# Patient Record
Sex: Female | Born: 1975 | Race: Black or African American | Hispanic: No | Smoking: Never smoker
Health system: Southern US, Community
[De-identification: ages and names within clinical notes are randomized; demographics above are authoritative.]

## PROBLEM LIST (undated history)

## (undated) DIAGNOSIS — D219 Benign neoplasm of connective and other soft tissue, unspecified: Secondary | ICD-10-CM

## (undated) DIAGNOSIS — R7303 Prediabetes: Secondary | ICD-10-CM

## (undated) DIAGNOSIS — F32A Depression, unspecified: Secondary | ICD-10-CM

## (undated) DIAGNOSIS — G43909 Migraine, unspecified, not intractable, without status migrainosus: Secondary | ICD-10-CM

## (undated) HISTORY — DX: Depression, unspecified: F32.A

## (undated) HISTORY — DX: Benign neoplasm of connective and other soft tissue, unspecified: D21.9

## (undated) HISTORY — DX: Prediabetes: R73.03

---

## 2013-05-14 ENCOUNTER — Emergency Department (HOSPITAL_COMMUNITY)
Admission: EM | Admit: 2013-05-14 | Discharge: 2013-05-14 | Disposition: A | Discharge status: Home / Self Care | Payer: BC Managed Care – PPO | Source: Home / Self Care

## 2013-05-14 ENCOUNTER — Encounter (HOSPITAL_COMMUNITY): Payer: Self-pay | Admitting: Emergency Medicine

## 2013-05-14 DIAGNOSIS — S239XXA Sprain of unspecified parts of thorax, initial encounter: Secondary | ICD-10-CM

## 2013-05-14 DIAGNOSIS — S29012A Strain of muscle and tendon of back wall of thorax, initial encounter: Secondary | ICD-10-CM

## 2013-05-14 LAB — POCT URINALYSIS DIP (DEVICE)
Bilirubin Urine: NEGATIVE
Glucose, UA: NEGATIVE mg/dL
Ketones, ur: NEGATIVE mg/dL
Leukocytes, UA: NEGATIVE
Nitrite: NEGATIVE
Protein, ur: NEGATIVE mg/dL
Specific Gravity, Urine: >=1.03 (ref 1.005–1.030)
Urobilinogen, UA: 0.2 mg/dL (ref 0.0–1.0)
pH: 6.5 (ref 5.0–8.0)

## 2013-05-14 NOTE — ED Provider Notes (Signed)
History     CSN: [id number]  Arrival date & time 05/14/13  1636   None     Chief Complaint  Patient presents with  . Flank Pain    bilateral kidney pain. x 2 wks     (Consider location/radiation/quality/duration/timing/severity/associated sxs/prior treatment) Patient is a 37 y.o. female presenting with flank pain. The history is provided by the patient.  Flank Pain This is a new problem. The current episode started more than 1 week ago (bilat midback pain, does strength training exercises.). The problem occurs constantly. The problem has not changed since onset.Pertinent negatives include no chest pain and no abdominal pain.    History reviewed. No pertinent past medical history.  History reviewed. No pertinent past surgical history.  History reviewed. No pertinent family history.  History  Substance Use Topics  . Smoking status: Never Smoker   . Smokeless tobacco: Not on file  . Alcohol Use: No    OB History   Grav Para Term Preterm Abortions TAB SAB Ect Mult Living                  Review of Systems  Cardiovascular: Negative for chest pain.  Gastrointestinal: Negative.  Negative for abdominal pain.  Genitourinary: Positive for flank pain and menstrual problem. Negative for dysuria, frequency and hematuria.       Has gyn appt in 1.5 wks   Neurological: Negative for weakness.    Allergies  Penicillins  Home Medications  No current outpatient prescriptions on file.  BP 126/78  Pulse 68  Temp(Src) 98.4 F (36.9 C) (Oral)  Resp 17  SpO2 100%  LMP 05/14/2013  Physical Exam  Nursing note and vitals reviewed. Constitutional: She is oriented to person, place, and time. She appears well-developed and well-nourished. No distress.  Abdominal: Soft. Bowel sounds are normal. She exhibits no distension and no mass. There is no tenderness. There is no rebound and no guarding.  Musculoskeletal:       Back:  Neurological: She is alert and oriented to person,  place, and time.  Skin: Skin is warm and dry.    ED Course  Procedures (including critical care time)  Labs Reviewed  POCT URINALYSIS DIP (DEVICE) - Abnormal; Notable for the following:    Hgb urine dipstick MODERATE (*)    All other components within normal limits   No results found.   1. Strain of mid-back, initial encounter       MDM  abnl u/a however pt currently on menses.        Linna Hoff, MD 05/14/13 [phone]

## 2013-05-14 NOTE — ED Notes (Signed)
Pt c/o bilateral kidney pain x 2 wks. Pt has been taking aleve with no relief.  Some nausea. Denies fever and any other symptoms.

## 2013-05-18 ENCOUNTER — Emergency Department (HOSPITAL_COMMUNITY)
Admission: EM | Admit: 2013-05-18 | Discharge: 2013-05-18 | Disposition: A | Discharge status: Home / Self Care | Payer: BC Managed Care – PPO | Attending: Emergency Medicine | Admitting: Emergency Medicine

## 2013-05-18 ENCOUNTER — Encounter (HOSPITAL_COMMUNITY): Payer: Self-pay | Admitting: Emergency Medicine

## 2013-05-18 DIAGNOSIS — Z791 Long term (current) use of non-steroidal anti-inflammatories (NSAID): Secondary | ICD-10-CM | POA: Insufficient documentation

## 2013-05-18 DIAGNOSIS — Z880 Allergy status to penicillin: Secondary | ICD-10-CM | POA: Insufficient documentation

## 2013-05-18 DIAGNOSIS — M549 Dorsalgia, unspecified: Secondary | ICD-10-CM | POA: Insufficient documentation

## 2013-05-18 DIAGNOSIS — Z8679 Personal history of other diseases of the circulatory system: Secondary | ICD-10-CM | POA: Insufficient documentation

## 2013-05-18 HISTORY — DX: Migraine, unspecified, not intractable, without status migrainosus: G43.909

## 2013-05-18 MED ORDER — OXYCODONE-ACETAMINOPHEN 5-325 MG PO TABS: 2.0000 | ORAL_TABLET | Freq: Four times a day (QID) | ORAL | Status: DC | PRN

## 2013-05-18 MED ORDER — METHOCARBAMOL 500 MG PO TABS
500.0000 mg | ORAL_TABLET | Freq: Once | ORAL | Status: AC
  Administered 2013-05-18: 500 mg via ORAL
  Filled 2013-05-18: qty 1

## 2013-05-18 MED ORDER — OXYCODONE-ACETAMINOPHEN 5-325 MG PO TABS
2.0000 | ORAL_TABLET | Freq: Once | ORAL | Status: AC
  Administered 2013-05-18: 2 via ORAL
  Filled 2013-05-18: qty 2

## 2013-05-18 MED ORDER — ONDANSETRON HCL 8 MG PO TABS: 8.0000 mg | ORAL_TABLET | Freq: Three times a day (TID) | ORAL | Status: DC | PRN

## 2013-05-18 MED ORDER — ONDANSETRON 8 MG PO TBDP
8.0000 mg | ORAL_TABLET | Freq: Once | ORAL | Status: AC
  Administered 2013-05-18: 8 mg via ORAL
  Filled 2013-05-18: qty 1

## 2013-05-18 MED ORDER — METHOCARBAMOL 500 MG PO TABS: 500.0000 mg | ORAL_TABLET | Freq: Two times a day (BID) | ORAL | Status: DC

## 2013-05-18 MED ORDER — OXYCODONE HCL 5 MG/5ML PO SOLN: 5.0000 mg | ORAL | Status: DC | PRN

## 2013-05-18 NOTE — ED Provider Notes (Signed)
History    This chart was scribed for non-physician practitioner, Junious Silk PA-C, working with Glynn Octave, MD by Donne Anon, ED Scribe. This patient was seen in room WTR6/WTR6 and the patient's care was started at 2035.   CSN: [id number]  Arrival date & time 05/18/13  1754   None     Chief Complaint  Patient presents with  . back spasms      The history is provided by the patient. No language interpreter was used.   HPI Comments: Brooke Lozano is a 37 y.o. female who presents to the Emergency Department complaining of 2 days of sudden onset, gradually worsening, constant back left sided back pain that radiates down the top of her right leg described as achy. She reports she was making her bed, bent over, and had a "jolt of pain." She states the pain is worse when she walks and better when she lays down. She states it feels like her back spasms when she walks. She has tried ice, heat and Aleive with little relief. She states she has there episodes approximately once every 3 years. She normally treats it with muscle relaxants, pain killers, ice and heat. She denies fever, nausea, vomiting, abdominal pain or any other pain. She denies a history of cancer, bowel or bladder incontinence, or drug abuse.   Past Medical History  Diagnosis Date  . Migraines     History reviewed. No pertinent past surgical history.  No family history on file.  History  Substance Use Topics  . Smoking status: Never Smoker   . Smokeless tobacco: Not on file  . Alcohol Use: No    OB History   Grav Para Term Preterm Abortions TAB SAB Ect Mult Living                  Review of Systems  Gastrointestinal: Negative for nausea, vomiting and abdominal pain.  Musculoskeletal: Positive for back pain.  All other systems reviewed and are negative.    Allergies  Other and Penicillins  Home Medications   Current Outpatient Rx  Name  Route  Sig  Dispense  Refill  . naproxen sodium (ANAPROX)  220 MG tablet   Oral   Take 220 mg by mouth 2 (two) times daily with a meal.         . Probiotic Product (PROBIOTIC DAILY PO)   Oral   Take 1 capsule by mouth.           BP 104/67  Pulse 70  Temp(Src) 98.9 F (37.2 C) (Oral)  Resp 16  SpO2 100%  LMP 05/14/2013  Physical Exam  Nursing note and vitals reviewed. Constitutional: She is oriented to person, place, and time. She appears well-developed and well-nourished. No distress.  HENT:  Head: Normocephalic and atraumatic.  Right Ear: External ear normal.  Left Ear: External ear normal.  Nose: Nose normal.  Mouth/Throat: Oropharynx is clear and moist.  Eyes: Conjunctivae are normal.  Neck: Normal range of motion.  Cardiovascular: Normal rate, regular rhythm, normal heart sounds and intact distal pulses.   Pulmonary/Chest: Effort normal and breath sounds normal. No stridor. No respiratory distress. She has no wheezes. She has no rales.  Abdominal: Soft. She exhibits no distension.  Musculoskeletal: Normal range of motion.  Tenderness to palpation over musculature of left low back with deep palpation. Distal pulses intact. Straight leg raise negative bilaterally. No deformity, step offs, or bony tenderness of the spine.  Neurological: She is alert and oriented to  person, place, and time. She has normal strength.  Skin: Skin is warm and dry. She is not diaphoretic. No erythema.  Psychiatric: She has a normal mood and affect. Her behavior is normal.    ED Course  Procedures (including critical care time) DIAGNOSTIC STUDIES: Oxygen Saturation is 100% on RA, normal by my interpretation.    COORDINATION OF CARE: 8:39 PM Discussed treatment plan which includes muscle relaxants, ice and heat with pt at bedside and pt agreed to plan.    Labs Reviewed - No data to display No results found.   1. Back pain       MDM  Patient with back pain.  No neurological deficits and normal neuro exam.  Patient can walk but states is  painful.  No loss of bowel or bladder control.  No concern for cauda equina.  No fever, night sweats, weight loss, h/o cancer, IVDU.  RICE protocol and pain medicine indicated and discussed with patient.     I personally performed the services described in this documentation, which was scribed in my presence. The recorded information has been reviewed and is accurate.        Mora Bellman, PA-C 05/19/13 [phone]

## 2013-05-18 NOTE — ED Notes (Signed)
Per patient, was making bed when she bent over and got a "jolt of pain"

## 2013-05-19 NOTE — ED Provider Notes (Signed)
Medical screening examination/treatment/procedure(s) were performed by non-physician practitioner and as supervising physician I was immediately available for consultation/collaboration.   Glynn Octave, MD 05/19/13 [phone]

## 2013-05-24 ENCOUNTER — Other Ambulatory Visit: Payer: Self-pay | Admitting: Family Medicine

## 2013-05-24 ENCOUNTER — Other Ambulatory Visit (HOSPITAL_COMMUNITY)
Admission: RE | Admit: 2013-05-24 | Discharge: 2013-05-24 | Disposition: A | Discharge status: Home / Self Care | Payer: BC Managed Care – PPO | Source: Ambulatory Visit | Attending: Family Medicine | Admitting: Family Medicine

## 2013-05-24 DIAGNOSIS — Z01419 Encounter for gynecological examination (general) (routine) without abnormal findings: Secondary | ICD-10-CM | POA: Insufficient documentation

## 2013-12-19 ENCOUNTER — Ambulatory Visit: Payer: BC Managed Care – PPO | Admitting: Neurology

## 2014-01-11 ENCOUNTER — Other Ambulatory Visit (HOSPITAL_COMMUNITY): Payer: Self-pay | Admitting: Family Medicine

## 2014-01-11 ENCOUNTER — Ambulatory Visit (HOSPITAL_COMMUNITY)
Admission: RE | Admit: 2014-01-11 | Discharge: 2014-01-11 | Disposition: A | Discharge status: Home / Self Care | Payer: BC Managed Care – PPO | Source: Ambulatory Visit | Attending: Physician Assistant | Admitting: Physician Assistant

## 2014-01-11 DIAGNOSIS — M7989 Other specified soft tissue disorders: Secondary | ICD-10-CM

## 2014-01-11 DIAGNOSIS — M79669 Pain in unspecified lower leg: Secondary | ICD-10-CM

## 2014-01-11 DIAGNOSIS — M79609 Pain in unspecified limb: Secondary | ICD-10-CM

## 2014-01-11 NOTE — Progress Notes (Signed)
VASCULAR LAB PRELIMINARY  PRELIMINARY  PRELIMINARY  PRELIMINARY  Right lower extremity venous duplex completed.    Preliminary report:  Right:  No evidence of DVT, superficial thrombosis, or Baker's cyst.   Jashanti Clinkscale, RVS 01/11/2014, 3:07 PM

## 2014-06-14 ENCOUNTER — Other Ambulatory Visit (HOSPITAL_COMMUNITY)
Admission: RE | Admit: 2014-06-14 | Discharge: 2014-06-14 | Disposition: A | Discharge status: Home / Self Care | Payer: BC Managed Care – PPO | Source: Ambulatory Visit | Attending: Obstetrics & Gynecology | Admitting: Obstetrics & Gynecology

## 2014-06-14 ENCOUNTER — Other Ambulatory Visit: Payer: Self-pay | Admitting: Obstetrics & Gynecology

## 2014-06-14 DIAGNOSIS — Z113 Encounter for screening for infections with a predominantly sexual mode of transmission: Secondary | ICD-10-CM | POA: Insufficient documentation

## 2014-06-14 DIAGNOSIS — Z01419 Encounter for gynecological examination (general) (routine) without abnormal findings: Secondary | ICD-10-CM | POA: Insufficient documentation

## 2014-06-14 DIAGNOSIS — Z1151 Encounter for screening for human papillomavirus (HPV): Secondary | ICD-10-CM | POA: Insufficient documentation

## 2014-06-20 LAB — CYTOLOGY - PAP

## 2014-07-19 ENCOUNTER — Other Ambulatory Visit: Payer: Self-pay | Admitting: Family Medicine

## 2014-07-19 ENCOUNTER — Ambulatory Visit
Admission: RE | Admit: 2014-07-19 | Discharge: 2014-07-19 | Disposition: A | Discharge status: Home / Self Care | Payer: BC Managed Care – PPO | Source: Ambulatory Visit | Attending: Family Medicine | Admitting: Family Medicine

## 2014-07-19 DIAGNOSIS — T17308A Unspecified foreign body in larynx causing other injury, initial encounter: Secondary | ICD-10-CM

## 2014-09-26 ENCOUNTER — Other Ambulatory Visit: Payer: Self-pay | Admitting: Obstetrics & Gynecology

## 2018-09-21 ENCOUNTER — Emergency Department (HOSPITAL_COMMUNITY)
Admission: EM | Admit: 2018-09-21 | Discharge: 2018-09-22 | Disposition: A | Discharge status: Home / Self Care | Payer: BLUE CROSS/BLUE SHIELD | Attending: Emergency Medicine | Admitting: Emergency Medicine

## 2018-09-21 ENCOUNTER — Encounter (HOSPITAL_COMMUNITY): Payer: Self-pay | Admitting: Emergency Medicine

## 2018-09-21 ENCOUNTER — Other Ambulatory Visit: Payer: Self-pay

## 2018-09-21 DIAGNOSIS — B269 Mumps without complication: Secondary | ICD-10-CM

## 2018-09-21 DIAGNOSIS — R600 Localized edema: Secondary | ICD-10-CM | POA: Diagnosis present

## 2018-09-21 DIAGNOSIS — K1121 Acute sialoadenitis: Secondary | ICD-10-CM | POA: Diagnosis not present

## 2018-09-21 NOTE — ED Triage Notes (Signed)
Pt presents to ED for sudden onset of swelling and pressure to right jaw with numbness radiating to her right eyebrow.  Patient denies recent ear pain/drainage, denies teeth needing attention, denies any new medicines or creams.

## 2018-09-22 ENCOUNTER — Encounter (HOSPITAL_COMMUNITY): Payer: Self-pay | Admitting: Physician Assistant

## 2018-09-22 ENCOUNTER — Emergency Department (HOSPITAL_COMMUNITY): Payer: BLUE CROSS/BLUE SHIELD

## 2018-09-22 LAB — BASIC METABOLIC PANEL
Anion gap: 8 (ref 5–15)
BUN: 10 mg/dL (ref 6–20)
CO2: 25 mmol/L (ref 22–32)
Calcium: 9.4 mg/dL (ref 8.9–10.3)
Chloride: 104 mmol/L (ref 98–111)
Creatinine, Ser: 0.59 mg/dL (ref 0.44–1.00)
GFR calc Af Amer: >60 mL/min (ref >60)
GFR calc non Af Amer: >60 mL/min (ref >60)
Glucose, Bld: 105 mg/dL — ABNORMAL HIGH (ref 70–99)
Potassium: 3.7 mmol/L (ref 3.5–5.1)
Sodium: 137 mmol/L (ref 135–145)

## 2018-09-22 LAB — CBC
HCT: 38.6 % (ref 36.0–46.0)
Hemoglobin: 12.1 g/dL (ref 12.0–15.0)
MCH: 26.2 pg (ref 26.0–34.0)
MCHC: 31.3 g/dL (ref 30.0–36.0)
MCV: 83.7 fL (ref 80.0–100.0)
Platelets: 299 10*3/uL (ref 150–400)
RBC: 4.61 MIL/uL (ref 3.87–5.11)
RDW: 14.9 % (ref 11.5–15.5)
WBC: 8.6 10*3/uL (ref 4.0–10.5)
nRBC: 0 % (ref 0.0–0.2)

## 2018-09-22 MED ORDER — IOHEXOL 300 MG/ML  SOLN
75.0000 mL | Freq: Once | INTRAMUSCULAR | Status: AC | PRN
  Administered 2018-09-22: 75 mL via INTRAVENOUS

## 2018-09-22 NOTE — ED Notes (Signed)
D/c reviewed with patient. Encouraged to follow d/c protocol

## 2018-09-22 NOTE — Discharge Instructions (Addendum)
Your tests to evaluate for months may take a few days to return.  You will most likely be contacted.  I have given you the information for infectious diseases.  If you have any questions or concerns please call their office.  Please wear a mask, and stay home.  Please do not be around anyone who does not have the full MMR series, is immunosuppressed, pregnant, or under 43 years old.   Please see additional information provided by CDC and Up to date.  "The infectious period is considered from 2 days before to 5 days after parotitis onset, although virus has been isolated from saliva as early as 7 days prior to and up to 9 days after parotitis onset. Mumps virus has also been isolated up to 14 days in urine and semen. When a person is ill with mumps, they should avoid contact with others from the time of diagnosis until 5 days after the onset of parotitis by staying home from work or school and staying in a separate room if possible."

## 2018-09-22 NOTE — ED Provider Notes (Signed)
[facility] EMERGENCY DEPARTMENT Provider Note   CSN: [id number] Arrival date & time: 09/21/18  2243     History   Chief Complaint Chief Complaint  Patient presents with  . Facial Pain    HPI Brooke Lozano is a 42 y.o. female who presents today for evaluation of sudden onset of right-sided jaw swelling.  She reports that it around 9 PM she started feeling fullness on her right jaw, and then when she looked in the mirror she had facial swelling on the right side.  She reports that it gets worse whenever she eats or drinks anything and then gradually goes down.  She denies any recent dental procedures or dental pain, regularly sees a dentist without any reported bad teeth.  She denies any new medications, has not eaten anything new recently or used any new facial products.  She does not have any history of similar.  She reports that she is fully vaccinated, has not been around anyone with known mumps.   She reports a slight decrease in sensation on the right side of her face.  HPI  Past Medical History:  Diagnosis Date  . Migraines     There are no active problems to display for this patient.   History reviewed. No pertinent surgical history.   OB History   None      Home Medications    Prior to Admission medications   Medication Sig Start Date End Date Taking? Authorizing Provider  acetaminophen (TYLENOL) 500 MG tablet Take 1,000 mg by mouth every 6 (six) hours as needed for headache.   Yes [provider]  naproxen sodium (ANAPROX) 220 MG tablet Take 220 mg by mouth 2 (two) times daily as needed (pain).    Yes [provider]  methocarbamol (ROBAXIN) 500 MG tablet Take 1 tablet (500 mg total) by mouth 2 (two) times daily. Patient not taking: Reported on 09/22/2018 05/18/13   Cleatrice Burke, PA-C  ondansetron (ZOFRAN) 8 MG tablet Take 1 tablet (8 mg total) by mouth every 8 (eight) hours as needed for nausea. Patient not taking:  Reported on 09/22/2018 05/18/13   Cleatrice Burke, PA-C  oxyCODONE (ROXICODONE) 5 MG/5ML solution Take 5 mLs (5 mg total) by mouth every 4 (four) hours as needed for pain. Patient not taking: Reported on 09/22/2018 05/18/13   Cleatrice Burke, PA-C  oxyCODONE-acetaminophen (PERCOCET/ROXICET) 5-325 MG per tablet Take 2 tablets by mouth every 6 (six) hours as needed for pain. Patient not taking: Reported on 09/22/2018 05/18/13   Cleatrice Burke, PA-C    Family History History reviewed. No pertinent family history.  Social History Social History   Tobacco Use  . Smoking status: Never Smoker  . Smokeless tobacco: Never Used  Substance Use Topics  . Alcohol use: No  . Drug use: No     Allergies   Other and Penicillins   Review of Systems Review of Systems  Constitutional: Negative for chills and fever.  HENT: Positive for facial swelling. Negative for congestion, dental problem, ear discharge, ear pain, mouth sores, postnasal drip, sinus pressure, sinus pain, sore throat, trouble swallowing and voice change.   Eyes: Negative for pain and visual disturbance.  Respiratory: Negative for cough, chest tightness and shortness of breath.   Cardiovascular: Negative for chest pain and palpitations.  Gastrointestinal: Negative for abdominal pain, nausea and vomiting.  Genitourinary: Negative for dysuria and hematuria.  Musculoskeletal: Negative for arthralgias, back pain, myalgias, neck pain and neck stiffness.  Skin: Negative for  color change and rash.  Neurological: Negative for seizures, syncope and headaches.  All other systems reviewed and are negative.    Physical Exam Updated Vital Signs BP 121/82   Pulse 82   Temp 99 F (37.2 C) (Oral)   Resp 18   SpO2 100%   Physical Exam  Constitutional: She is oriented to person, place, and time. She appears well-developed and well-nourished. No distress.  HENT:  Head: Atraumatic.  Right Ear: Tympanic membrane, external ear and ear canal  normal. No mastoid tenderness.  Left Ear: Tympanic membrane, external ear and ear canal normal. No mastoid tenderness.  Nose: Nose normal. Right sinus exhibits no maxillary sinus tenderness and no frontal sinus tenderness. Left sinus exhibits no maxillary sinus tenderness and no frontal sinus tenderness.  Mouth/Throat: Uvula is midline, oropharynx is clear and moist and mucous membranes are normal. No oral lesions. No trismus in the jaw. Normal dentition. No dental abscesses, uvula swelling or dental caries. No oropharyngeal exudate, posterior oropharyngeal edema, posterior oropharyngeal erythema or tonsillar abscesses. No tonsillar exudate.  Slight swelling around the right sided corner of the jaw.  This area is not obviously indurated, it is not warm or tender to palpation.  No TTP or palpable enlargement of left parotid gland, submandibular parotid glands.   Eyes: Conjunctivae are normal. Right eye exhibits no discharge. Left eye exhibits no discharge. No scleral icterus.  Neck: Normal range of motion. Neck supple. No JVD present. No tracheal deviation present. No thyromegaly present.  Cardiovascular: Normal rate, regular rhythm, normal heart sounds and intact distal pulses.  No murmur heard. Pulmonary/Chest: Effort normal and breath sounds normal. No stridor. No respiratory distress.  Abdominal: She exhibits no distension.  Musculoskeletal: She exhibits no edema or deformity.  Lymphadenopathy:    She has no cervical adenopathy.  Neurological: She is alert and oriented to person, place, and time. She exhibits normal muscle tone.  Sensation intact and symmetrical on face to light touch bilaterally.  No facial droop, tongue protrusion and palate elevation is symmetric.  Speech is not slurred.  Skin: Skin is warm and dry. She is not diaphoretic.  Psychiatric: She has a normal mood and affect. Her behavior is normal.  Nursing note and vitals reviewed.    ED Treatments / Results  Labs (all labs  ordered are listed, but only abnormal results are displayed) Labs Reviewed  BASIC METABOLIC PANEL - Abnormal; Notable for the following components:      Result Value   Glucose, Bld 105 (*)    All other components within normal limits  CBC  MISC [facility] TEST (SEND OUT)    EKG None  Radiology Ct Soft Tissue Neck W Contrast  Result Date: 09/22/2018 CLINICAL DATA:  Right parotid pain. EXAM: CT NECK WITH CONTRAST TECHNIQUE: Multidetector CT imaging of the neck was performed using the standard protocol following the bolus administration of intravenous contrast. CONTRAST:  56mL OMNIPAQUE IOHEXOL 300 MG/ML  SOLN COMPARISON:  None. FINDINGS: PHARYNX AND LARYNX: --Nasopharynx: Fossae of Rosenmuller are clear. Normal adenoid tonsils for age. --Oral cavity and oropharynx: The palatine and lingual tonsils are normal. The visible oral cavity and floor of mouth are normal. --Hypopharynx: Normal vallecula and pyriform sinuses. --Larynx: Normal epiglottis and pre-epiglottic space. Normal aryepiglottic and vocal folds. --Retropharyngeal space: No abscess, effusion or lymphadenopathy. SALIVARY GLANDS: --Parotid: Both parotid glands are enlarged. No inflammatory stranding. No sialolithiasis. --Submandibular: The submandibular glands are both enlarged. There is no ductal obstruction or sialolithiasis. No abnormal contrast enhancement. --Sublingual:  Normal. No ranula or other visible lesion of the base of tongue and floor of mouth. THYROID: Normal. LYMPH NODES: No enlarged or abnormal density lymph nodes. VASCULAR: Major cervical vessels are patent. LIMITED INTRACRANIAL: Normal. VISUALIZED ORBITS: Normal. MASTOIDS AND VISUALIZED PARANASAL SINUSES: No fluid levels or advanced mucosal thickening. No mastoid effusion. SKELETON: No bony spinal canal stenosis. No lytic or blastic lesions. UPPER CHEST: Clear. OTHER: None. IMPRESSION: Diffuse enlargement of the bilateral parotid and submandibular glands, which could indicate  early acute viral parotitis (including mumps), though there is no inflammatory change of the glands. No ductal obstruction or sialolithiasis. Electronically Signed   By: Ulyses Jarred M.D.   On: 09/22/2018 04:31    Procedures Procedures (including critical care time)  Medications Ordered in ED Medications  iohexol (OMNIPAQUE) 300 MG/ML solution 75 mL (75 mLs Intravenous Contrast Given 09/22/18 0355)     Initial Impression / Assessment and Plan / ED Course  I have reviewed the triage vital signs and the nursing notes.  Pertinent labs & imaging results that were available during my care of the patient were reviewed by me and considered in my medical decision making (see chart for details).  Clinical Course as of Sep 22 745  Thu Sep 22, 2018  0522 Patient placed on droplet precautions charge nurse notified of concern of mumps.    [EH]  0600 Patient made aware of mumps probable diagnosis    [EH]    Clinical Course User Index [EH] Lorin Glass, PA-C   Patient presents today for evaluation of sudden onset at 34 PM swelling of the right sided corner of the jaw.  Swelling is nontender.  Clinically she is nontoxic, she is not febrile tachycardic or tachypneic.  She denies any recent illness, is fully vaccinated (per her report), and has not been around anyone with known mumps. Her CT scan showed diffuse enlargement of the bilateral parotid glands and submandibular glands concerning for acute viral parotitis without inflammation change or ductal stones.  Only inflammation of the right parotid was visible on clinical exam.  After CT results concerning for viral parotitis including mumps she was placed on droplet cautions.  Extensive conversations were had between RN, infection prevention.  They reportedly checked with the state and the recommendation, since she has not been around known mumps exposure, is to test in house for mumps.    After consultation with lab order placed for the  RT-PCR and viral culture per [facility] clinical alert.    Patient does not have the classic prodromal symptoms, is afebrile without headaches, myalgias, or otherwise feeling unwell.  She was offered Tylenol in the department for which she declined.  Discussed at length with patient recommendations for quarantine from the CDC at home.  Patient given work note.    This patient was discussed with Dr. Leonides Schanz.    Patient given follow-up with infectious diseases as needed.   Return precautions were discussed with patient who states their understanding.  At the time of discharge patient denied any unaddressed complaints or concerns.  Patient is agreeable for discharge home.   Final Clinical Impressions(s) / ED Diagnoses   Final diagnoses:  Mumps without complication  Parotitis, acute    ED Discharge Orders    None       Ollen Gross 09/22/18 [id number]    Ward, Delice Bison, DO 09/27/18 0031

## 2018-09-22 NOTE — ED Notes (Signed)
This RN spoke with Infection Prevention (IP). Per pt S/S onset 2130, also unsure of immunization status. IP to call [facility] PA/ Ward DO to discuss testing

## 2018-09-26 LAB — MISC LABCORP TEST (SEND OUT)

## 2018-12-02 ENCOUNTER — Ambulatory Visit (INDEPENDENT_AMBULATORY_CARE_PROVIDER_SITE_OTHER): Payer: BLUE CROSS/BLUE SHIELD | Admitting: Nurse Practitioner

## 2018-12-02 ENCOUNTER — Encounter: Payer: Self-pay | Admitting: Nurse Practitioner

## 2018-12-02 VITALS — BP 116/68 | HR 88 | Temp 98.0°F | Ht 67.6 in | Wt 208.2 lb

## 2018-12-02 DIAGNOSIS — R590 Localized enlarged lymph nodes: Secondary | ICD-10-CM | POA: Diagnosis not present

## 2018-12-02 DIAGNOSIS — G43019 Migraine without aura, intractable, without status migrainosus: Secondary | ICD-10-CM | POA: Diagnosis not present

## 2018-12-02 DIAGNOSIS — R7309 Other abnormal glucose: Secondary | ICD-10-CM

## 2018-12-02 DIAGNOSIS — D509 Iron deficiency anemia, unspecified: Secondary | ICD-10-CM

## 2018-12-02 MED ORDER — ELETRIPTAN HYDROBROMIDE 20 MG PO TABS: 20.0000 mg | ORAL_TABLET | Freq: Once | ORAL | 5 refills | Status: DC

## 2018-12-02 NOTE — Progress Notes (Signed)
Subjective:     Patient ID: Brooke Lozano , female    DOB: 11/10/1976 , 42 y.o.   MRN: [id number]   Chief Complaint  Patient presents with  . med check    patient is taking replax for her migraines.    HPI  Headache   This is a chronic problem. The current episode started more than 1 year ago. The problem occurs intermittently. The problem has been rapidly improving. The pain is located in the bilateral region. The pain quality is not similar to prior headaches. The quality of the pain is described as aching. Pertinent negatives include no abdominal pain, dizziness, muscle aches or nausea. Nothing aggravates the symptoms. She has tried nothing for the symptoms.     Past Medical History:  Diagnosis Date  . Migraines      No family history on file.   Current Outpatient Medications:  .  eletriptan (RELPAX) 20 MG tablet, Take 20 mg by mouth once. May repeat in 2 hours if headache persists or recurs., Disp: , Rfl:  .  fexofenadine (ALLEGRA) 180 MG tablet, Take 180 mg by mouth daily., Disp: , Rfl:  .  Iron-FA-B Cmp-C-Biot-Probiotic (FUSION PLUS PO), Take 1 capsule by mouth daily., Disp: , Rfl:  .  naproxen sodium (ANAPROX) 220 MG tablet, Take 220 mg by mouth 2 (two) times daily as needed (pain). , Disp: , Rfl:  .  Omega-3 Fatty Acids (FISH OIL CONCENTRATE PO), Take 1 capsule by mouth daily., Disp: , Rfl:    Allergies  Allergen Reactions  . Other     NAUSEA MEDICINE PRESCRIBED TO PREGNANT WOMEN-CAUSED SLURRED SPEECH  . Penicillins Nausea And Vomiting     Review of Systems  Constitutional: Negative.   HENT: Negative.   Eyes: Negative.   Respiratory: Negative.   Cardiovascular: Negative.   Gastrointestinal: Negative for abdominal pain and nausea.  Endocrine: Negative for polydipsia, polyphagia and polyuria.  Neurological: Positive for headaches. Negative for dizziness.  Hematological: Positive for adenopathy.     Today's Vitals   12/02/18 1045  BP: 116/68  Pulse: 88   Temp: 98 F (36.7 C)  TempSrc: Oral  SpO2: 98%  Weight: 208 lb 3.2 oz (94.4 kg)  Height: 5' 7.6" (1.717 m)  PainSc: 0-No pain   Body mass index is 32.03 kg/m.   Objective:  Physical Exam Vitals signs reviewed.  Constitutional:      Appearance: Normal appearance.  HENT:     Head: Normocephalic and atraumatic.     Nose: Nose normal.     Mouth/Throat:     Mouth: Mucous membranes are moist.  Neck:     Musculoskeletal: Normal range of motion and neck supple. No muscular tenderness.  Cardiovascular:     Rate and Rhythm: Normal rate.     Pulses: Normal pulses.     Heart sounds: Normal heart sounds. No murmur.  Pulmonary:     Effort: Pulmonary effort is normal.     Breath sounds: Normal breath sounds.  Lymphadenopathy:     Head:     Right side of head: Submandibular adenopathy present.     Left side of head: No submental or submandibular adenopathy.     Cervical: No cervical adenopathy.  Skin:    General: Skin is warm.  Neurological:     General: No focal deficit present.     Mental Status: She is alert and oriented to person, place, and time.  Psychiatric:        Mood and Affect:  Mood normal.         Assessment And Plan:     1. Enlarged lymph node in neck  Right submandibular mild adenopathy, will refer to ENT for further evaluation - Ambulatory referral to ENT - CBC no Diff  2. Intractable migraine without aura and without status migrainosus  Chronic, intermittent with good relief with relpax  She is to keep a journal of how frequently she is having headaches if more than 15 per month will consider daily treatment - eletriptan (RELPAX) 20 MG tablet; Take 1 tablet (20 mg total) by mouth once for 1 dose. May repeat in 2 hours if headache persists or recurs.  Dispense: 10 tablet; Refill: 5  3. Abnormal glucose  Chronic, controlled  No current medications  Encouraged to limit intake of sugary foods and drinks  Encouraged to increase physical activity to  150 minutes per week - Hemoglobin A1c  4. Iron deficiency anemia, unspecified iron deficiency anemia type  Chronic, will check iron studies  Pending results will send Rx for iron supplement - Iron, TIBC and Ferritin Panel - Iron-FA-B Cmp-C-Biot-Probiotic (FUSION PLUS) CAPS; Take 1 capsule by mouth daily.  Dispense: 30 capsule; Refill: Omega, FNP

## 2018-12-03 LAB — CBC
Hematocrit: 33.9 % — ABNORMAL LOW (ref 34.0–46.6)
Hemoglobin: 11.1 g/dL (ref 11.1–15.9)
MCH: 25.6 pg — ABNORMAL LOW (ref 26.6–33.0)
MCHC: 32.7 g/dL (ref 31.5–35.7)
MCV: 78 fL — ABNORMAL LOW (ref 79–97)
Platelets: 262 10*3/uL (ref 150–450)
RBC: 4.33 x10E6/uL (ref 3.77–5.28)
RDW: 14.1 % (ref 12.3–15.4)
WBC: 8.6 10*3/uL (ref 3.4–10.8)

## 2018-12-03 LAB — HEMOGLOBIN A1C
Est. average glucose Bld gHb Est-mCnc: 123 mg/dL
Hgb A1c MFr Bld: 5.9 % — ABNORMAL HIGH (ref 4.8–5.6)

## 2018-12-03 LAB — IRON,TIBC AND FERRITIN PANEL
Ferritin: 7 ng/mL — ABNORMAL LOW (ref 15–150)
Iron Saturation: 12 % — ABNORMAL LOW (ref 15–55)
Iron: 40 ug/dL (ref 27–159)
Total Iron Binding Capacity: 341 ug/dL (ref 250–450)
UIBC: 301 ug/dL (ref 131–425)

## 2018-12-06 MED ORDER — FUSION PLUS PO CAPS: 1.0000 | ORAL_CAPSULE | Freq: Every day | ORAL | 5 refills | Status: DC

## 2018-12-11 ENCOUNTER — Encounter: Payer: Self-pay | Admitting: Nurse Practitioner

## 2018-12-28 NOTE — Progress Notes (Signed)
Yes we can check her HgbA1c in 3 months as well.

## 2019-03-28 ENCOUNTER — Telehealth: Payer: Self-pay

## 2019-03-28 NOTE — Telephone Encounter (Signed)
The pt called and said she needed to cancel her appt for tomorrow because she has to straighten her insurance out.

## 2019-03-29 ENCOUNTER — Ambulatory Visit: Payer: Self-pay | Admitting: Nurse Practitioner

## 2019-06-08 ENCOUNTER — Telehealth: Payer: Self-pay

## 2019-06-08 NOTE — Telephone Encounter (Signed)
Patient called to cancel appointment for tomorrow 06/26 I returned pt call and notified it that it looks like she already reschedule her appt for 07/21. YRL,RMA

## 2019-06-09 ENCOUNTER — Encounter: Payer: Self-pay | Admitting: Nurse Practitioner

## 2019-07-04 ENCOUNTER — Encounter: Payer: Self-pay | Admitting: Nurse Practitioner

## 2019-10-07 IMAGING — CT CT NECK W/ CM
5 of 6 series · 15 of 33 positions shown, 17 images · IV contrast (APPLIED)
Comparison: None.

CLINICAL DATA: Right parotid pain.

EXAM:
CT NECK WITH CONTRAST
TECHNIQUE: Multidetector CT imaging of the neck was performed using the
standard protocol following the bolus administration of intravenous
contrast.
CONTRAST:  75mL OMNIPAQUE IOHEXOL 300 MG/ML  SOLN

[Series 3: axial neck · axial · 0.46mm/px · z∈[-248,-150]mm · 3 of 99 slices shown, 4 images]
[im 25/99  soft-tissue]
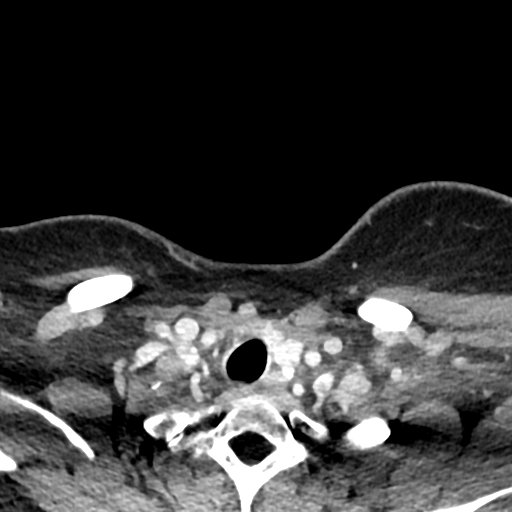
[im 25/99  bone]
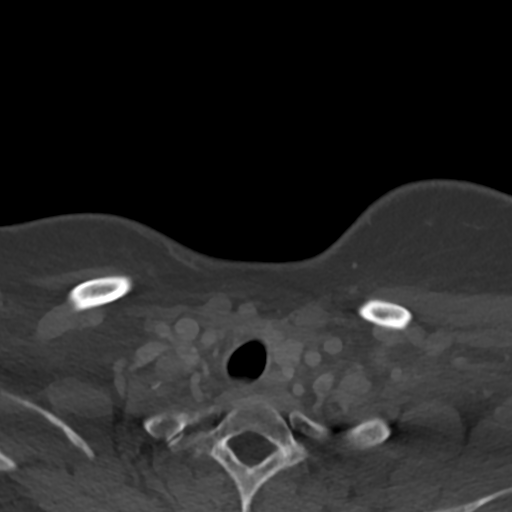
[im 50/99  bone]
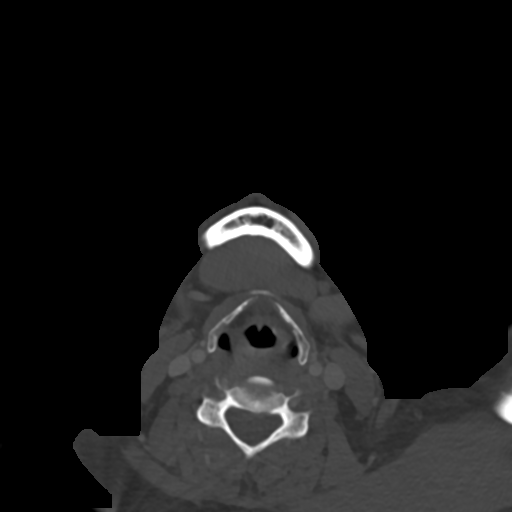
[im 74/99  bone]
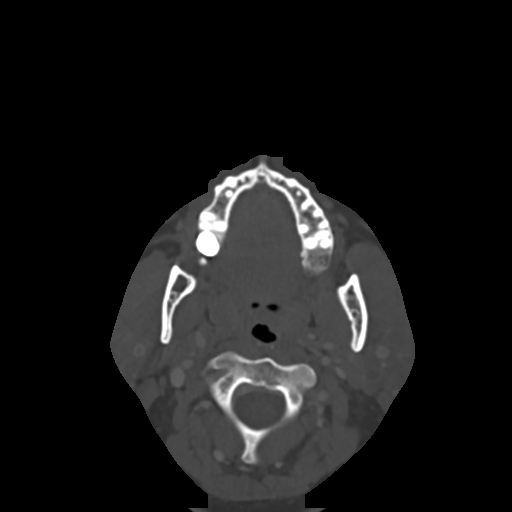

[Series 5: axial bone · axial · 0.46mm/px · z∈[-232,-166]mm · 2 of 99 slices shown]
[im 33/99  bone]
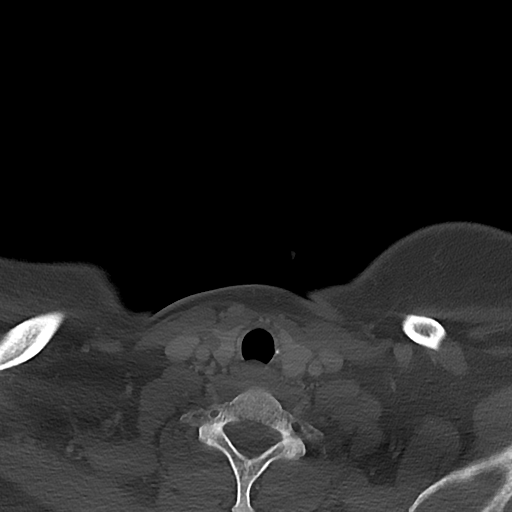
[im 66/99  bone]
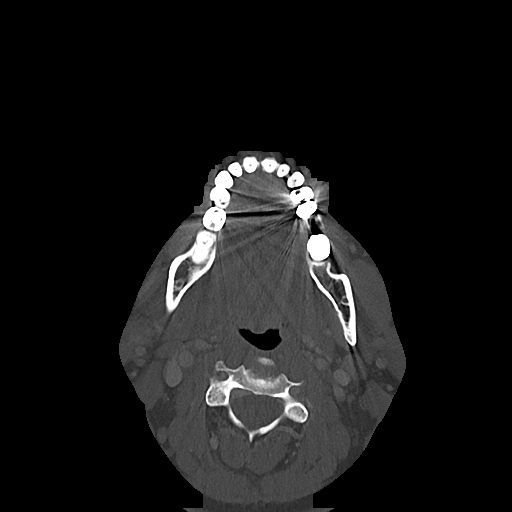

[Series 6: sag neck · sagittal · 0.39mm/px · 5 of 89 slices shown, 6 images]
[im 30/89  bone]
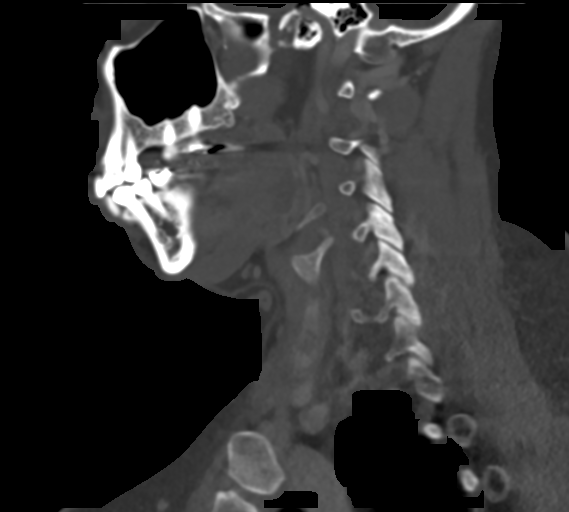
[im 37/89  bone]
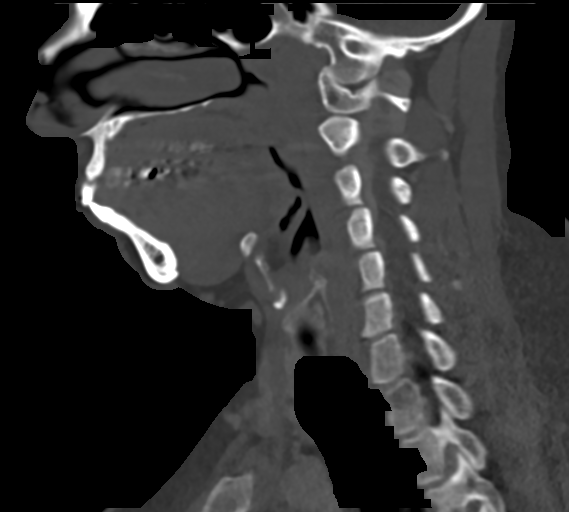
[im 45/89  soft-tissue]
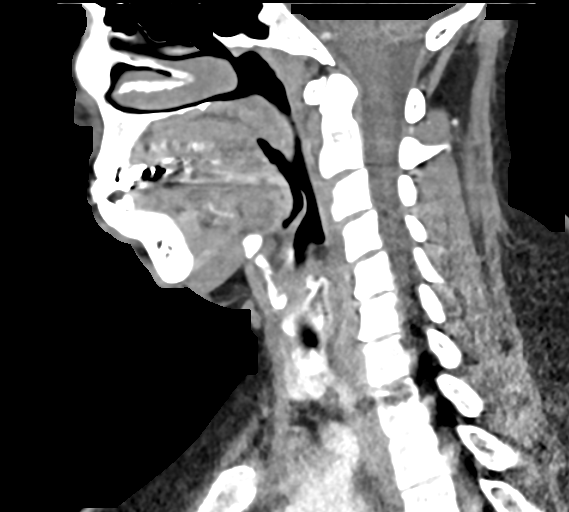
[im 45/89  bone]
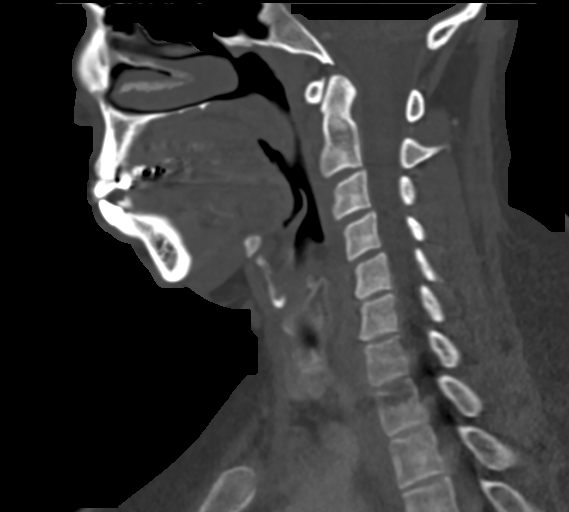
[im 52/89  bone]
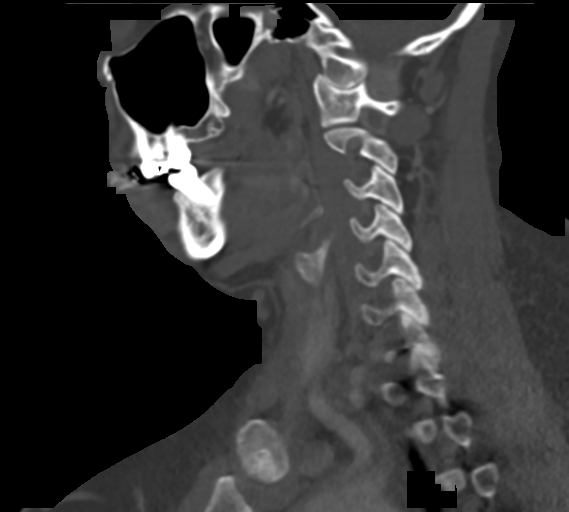
[im 59/89  bone]
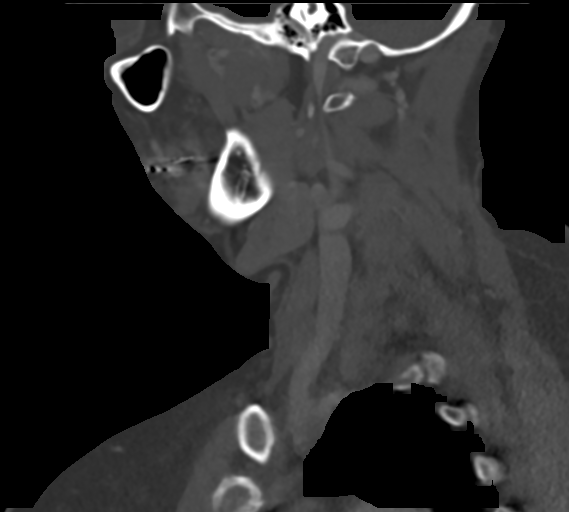

[Series 7: cor neck · coronal · 0.39mm/px · 3 of 106 slices shown]
[im 22/106  bone]
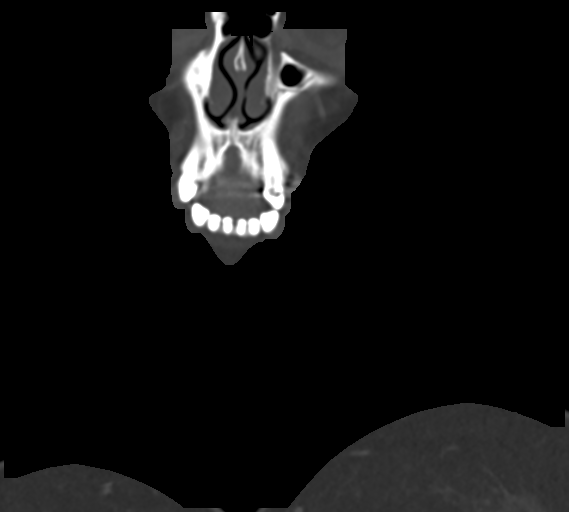
[im 43/106  bone]
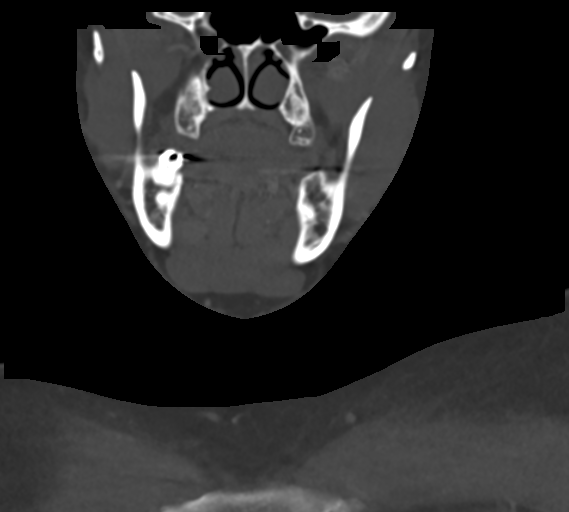
[im 64/106  bone]
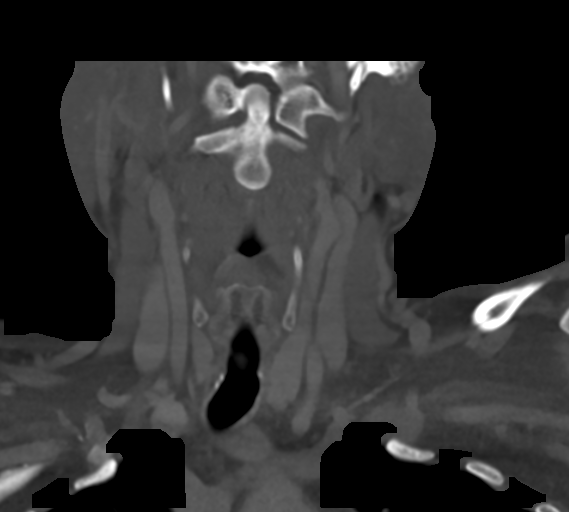

[Series 8: ax oropharynx · axial · 0.39mm/px · z∈[-257,-195]mm · 2 of 98 slices shown]
[im 33/98  bone]
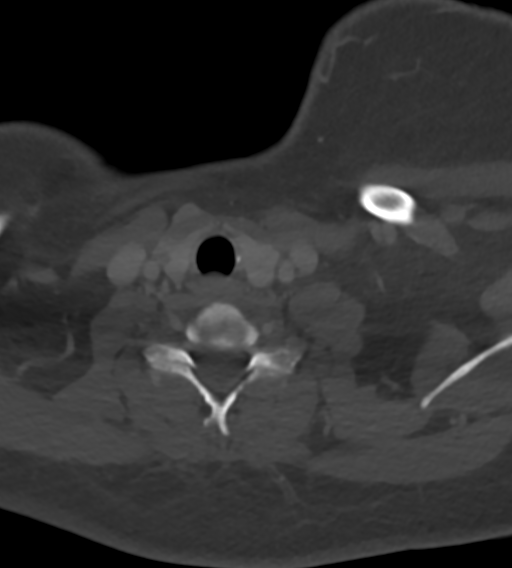
[im 65/98  bone]
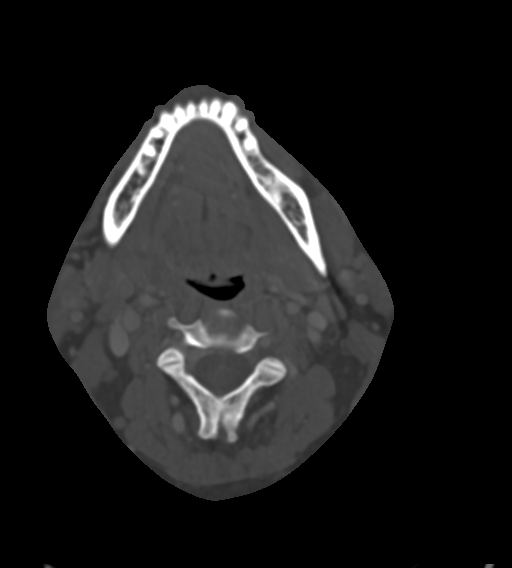

[15 of 33 positions shown; findings below may reference images not displayed]

FINDINGS: PHARYNX AND LARYNX:

--Nasopharynx: Fossae of Ulises are clear. Normal adenoid
tonsils for age.

--Oral cavity and oropharynx: The palatine and lingual tonsils are
normal. The visible oral cavity and floor of mouth are normal.

--Hypopharynx: Normal vallecula and pyriform sinuses.

--Larynx: Normal epiglottis and pre-epiglottic space. Normal
aryepiglottic and vocal folds.

--Retropharyngeal space: No abscess, effusion or lymphadenopathy.

SALIVARY GLANDS:

--Parotid: Both parotid glands are enlarged. No inflammatory
stranding. No sialolithiasis.

--Submandibular: The submandibular glands are both enlarged. There
is no ductal obstruction or sialolithiasis. No abnormal contrast
enhancement.

--Sublingual: Normal. No ranula or other visible lesion of the base
of tongue and floor of mouth.

THYROID: Normal.

LYMPH NODES: No enlarged or abnormal density lymph nodes.

VASCULAR: Major cervical vessels are patent.

LIMITED INTRACRANIAL: Normal.

VISUALIZED ORBITS: Normal.

MASTOIDS AND VISUALIZED PARANASAL SINUSES: No fluid levels or
advanced mucosal thickening. No mastoid effusion.

SKELETON: No bony spinal canal stenosis. No lytic or blastic
lesions.

UPPER CHEST: Clear.

OTHER: None.
IMPRESSION: Diffuse enlargement of the bilateral parotid and submandibular
glands, which could indicate early acute viral parotitis (including
mumps), though there is no inflammatory change of the glands. No
ductal obstruction or sialolithiasis.

## 2020-07-16 ENCOUNTER — Other Ambulatory Visit: Payer: Self-pay

## 2020-07-16 ENCOUNTER — Encounter: Payer: Self-pay | Admitting: Nurse Practitioner

## 2020-07-16 ENCOUNTER — Ambulatory Visit: Payer: 59 | Admitting: Nurse Practitioner

## 2020-07-16 VITALS — BP 110/82 | HR 82 | Temp 98.1°F | Ht 67.6 in | Wt 181.6 lb

## 2020-07-16 DIAGNOSIS — E663 Overweight: Secondary | ICD-10-CM | POA: Diagnosis not present

## 2020-07-16 DIAGNOSIS — R079 Chest pain, unspecified: Secondary | ICD-10-CM

## 2020-07-16 DIAGNOSIS — R002 Palpitations: Secondary | ICD-10-CM

## 2020-07-16 DIAGNOSIS — Z6827 Body mass index (BMI) 27.0-27.9, adult: Secondary | ICD-10-CM | POA: Diagnosis not present

## 2020-07-16 NOTE — Progress Notes (Signed)
I,Katawbba Wiggins,acting as a Education administrator for [employer], FNP.,have documented all relevant documentation on the behalf of Minette Brine, FNP,as directed by  Minette Brine, FNP while in the presence of Minette Brine, [place].  This visit occurred during the SARS-CoV-2 public health emergency.  Safety protocols were in place, including screening questions prior to the visit, additional usage of staff PPE, and extensive cleaning of exam room while observing appropriate contact time as indicated for disinfecting solutions.  Subjective:     Patient ID: Brooke Lozano , female    DOB: [birth year] , 44 y.o.   MRN: [id number]   Chief Complaint  Patient presents with  . Chest Pain    HPI  The patient is here today for evaluation of chest pain.  She received the covid vaccine on 6/26 and approximately 1.5 weeks after she began with chest pain (intemittent lasting a few minutes waking her out of her sleep) and heart fluttering (lasting a few minutes). Chest pain occurring every day - dull and aching sensation.  Denies shortness of breath.  Denies caffeine intake. She denies additional stress other than every day stress. She is drinking approximately 5 - 16 oz bottles of water including coconut water.   Family history of heart problems - Sister has a history of CHF.      Past Medical History:  Diagnosis Date  . Migraines      History reviewed. No pertinent family history.   Current Outpatient Medications:  .  Cyanocobalamin (VITAMIN B 12 PO), Take by mouth., Disp: , Rfl:  .  Ferrous Sulfate (IRON SUPPLEMENT PO), Take by mouth. daily, Disp: , Rfl:  .  VITAMIN D PO, Take by mouth. Not lately, Disp: , Rfl:  .  eletriptan (RELPAX) 20 MG tablet, Take 1 tablet (20 mg total) by mouth once for 1 dose. May repeat in 2 hours if headache persists or recurs. (Patient not taking: Reported on 07/16/2020), Disp: 10 tablet, Rfl: 5 .  fexofenadine (ALLEGRA) 180 MG tablet, Take 180 mg by mouth daily. (Patient not taking:  Reported on 07/16/2020), Disp: , Rfl:    Allergies  Allergen Reactions  . Other     NAUSEA MEDICINE PRESCRIBED TO PREGNANT WOMEN-CAUSED SLURRED SPEECH  . Penicillins Nausea And Vomiting     Review of Systems  Constitutional: Negative.  Negative for fatigue.  Respiratory: Negative.  Negative for shortness of breath.   Cardiovascular: Positive for chest pain and palpitations. Negative for leg swelling.  Gastrointestinal: Negative.   Neurological: Negative for dizziness and headaches.  Psychiatric/Behavioral: Negative.   All other systems reviewed and are negative.    Today's Vitals   07/16/20 1208  BP: 110/82  Pulse: 82  Temp: 98.1 F (36.7 C)  TempSrc: Oral  Weight: 181 lb 9.6 oz (82.4 kg)  Height: 5' 7.6" (1.717 m)   Body mass index is 27.94 kg/m.  Wt Readings from Last 3 Encounters:  07/16/20 181 lb 9.6 oz (82.4 kg)  12/02/18 208 lb 3.2 oz (94.4 kg)   Objective:  Physical Exam Vitals and nursing note reviewed.  Constitutional:      Appearance: Normal appearance. She is obese.  HENT:     Head: Normocephalic and atraumatic.  Cardiovascular:     Rate and Rhythm: Normal rate and regular rhythm.     Heart sounds: Normal heart sounds.  Pulmonary:     Effort: Pulmonary effort is normal.     Breath sounds: Normal breath sounds.  Skin:    General: Skin is warm.  Neurological:     General: No focal deficit present.     Mental Status: She is alert and oriented to person, place, and time.         Assessment And Plan:     1. Chest pain, unspecified type  She was seen at urgent care had EKG done, I do not have the results but have requested them.  No pain illicited on palpation  She is requesting a troponin which I explained to her these are generally done in the acute phase with a series of troponins. Attempted to order but unable through [facility] but did order a CK. - Hepatitis C antibody - Ambulatory referral to Cardiology - CK, total  2. Overweight with body  mass index (BMI) of 27 to 27.9 in adult  Continue with regular exercise and healthy diet  3. Fluttering sensation of heart  No abnormal findings on physical exam  Will check for metabolic causes  Will refer to cardiology for further evaluation due to her concern of symptoms starting after her covid vaccine - TSH - CBC - BMP8+eGFR - Iron, TIBC and Ferritin Panel - Hepatitis C antibody - Ambulatory referral to Cardiology - CK, total     Patient was given opportunity to ask questions. Patient verbalized understanding of the plan and was able to repeat key elements of the plan. All questions were answered to their satisfaction.  Minette Brine, FNP   I, Minette Brine, FNP, have reviewed all documentation for this visit. The documentation on 07/18/20 for the exam, diagnosis, procedures, and orders are all accurate and complete.  THE PATIENT IS ENCOURAGED TO PRACTICE SOCIAL DISTANCING DUE TO THE COVID-19 PANDEMIC.

## 2020-07-17 LAB — BMP8+EGFR
BUN/Creatinine Ratio: 12 (ref 9–23)
BUN: 7 mg/dL (ref 6–24)
CO2: 26 mmol/L (ref 20–29)
Calcium: 9.6 mg/dL (ref 8.7–10.2)
Chloride: 101 mmol/L (ref 96–106)
Creatinine, Ser: 0.6 mg/dL (ref 0.57–1.00)
GFR calc Af Amer: 127 mL/min/{1.73_m2} (ref >59)
GFR calc non Af Amer: 110 mL/min/{1.73_m2} (ref >59)
Glucose: 84 mg/dL (ref 65–99)
Potassium: 4.1 mmol/L (ref 3.5–5.2)
Sodium: 139 mmol/L (ref 134–144)

## 2020-07-17 LAB — IRON,TIBC AND FERRITIN PANEL
Ferritin: 14 ng/mL — ABNORMAL LOW (ref 15–150)
Iron Saturation: 22 % (ref 15–55)
Iron: 72 ug/dL (ref 27–159)
Total Iron Binding Capacity: 327 ug/dL (ref 250–450)
UIBC: 255 ug/dL (ref 131–425)

## 2020-07-17 LAB — CBC
Hematocrit: 39.6 % (ref 34.0–46.6)
Hemoglobin: 13.3 g/dL (ref 11.1–15.9)
MCH: 28.4 pg (ref 26.6–33.0)
MCHC: 33.6 g/dL (ref 31.5–35.7)
MCV: 85 fL (ref 79–97)
Platelets: 218 10*3/uL (ref 150–450)
RBC: 4.68 x10E6/uL (ref 3.77–5.28)
RDW: 13.3 % (ref 11.7–15.4)
WBC: 6.2 10*3/uL (ref 3.4–10.8)

## 2020-07-17 LAB — CK: Total CK: 135 U/L (ref 32–182)

## 2020-07-17 LAB — TSH: TSH: 1.33 u[IU]/mL (ref 0.450–4.500)

## 2020-07-17 LAB — HEPATITIS C ANTIBODY: Hep C Virus Ab: <0.1 s/co ratio (ref 0.0–0.9)

## 2020-07-22 ENCOUNTER — Telehealth: Payer: Self-pay

## 2020-07-25 ENCOUNTER — Encounter: Payer: Self-pay | Admitting: Nurse Practitioner

## 2020-08-09 DIAGNOSIS — G43909 Migraine, unspecified, not intractable, without status migrainosus: Secondary | ICD-10-CM | POA: Insufficient documentation

## 2020-08-11 ENCOUNTER — Encounter: Payer: Self-pay | Admitting: Cardiology

## 2020-08-11 NOTE — Progress Notes (Signed)
Cardiology Office Note:    Date:  08/12/2020   ID:  Brooke Lozano, DOB April 10, 1976, MRN [id number]  PCP:  Minette Brine, FNP  Cardiologist:  Shirlee More, MD   Referring MD: Minette Brine, FNP  ASSESSMENT:    1. Chest pain of uncertain etiology   2. Palpitations    PLAN:    In order of problems listed above:  1. 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  Concern here is its association with COVID-19 immunization.  About 100,000 people can get cardiac inflammation more common in men than women more common young teenagers in [employer] recruits than older.  Her EKG is normal.  With her associated shortness of breath we will do an echocardiogram for reassuring I will leave further follow-up my office as needed I encouraged her to get her second dose of Covid immunization. 2. At this time I would not pursue with infrequent symptoms  Next appointment as needed   Medication Adjustments/Labs and Tests Ordered: Current medicines are reviewed at length with the patient today.  Concerns regarding medicines are outlined above.  Orders Placed This Encounter  Procedures  . EKG 12-Lead  . ECHOCARDIOGRAM COMPLETE   No orders of the defined types were placed in this encounter.    Chief Complaint  Patient presents with  . Chest Pain  . Palpitations    History of Present Illness:    Brooke Lozano is a 44 y.o. female who is being seen today for the evaluation of chest pain at the request of Minette Brine, [place]. Her symptoms  occurred 1 and 1/2 weeks after Covid immunization and described as chest pain awakening her from her sleep and palpitation with her heart fluttering.  She was seen by her PCP 07/16/2020 and had had an urgent care visit unfortunately the EKG was unavailable. Outpatient labs were performed including iron studies with a low ferritin but normal iron binding capacity iron and iron saturation.  His  CK total was assessed that was normal as was TSH.  CBC showed a low normal hemoglobin 11.1 microcytic indices and BMP showed normal renal function GFR 127 cc potassium 4.1.   She is concerned pending her second immunization.  Her chest pain initially waxed and waned different locations left and right chest would last seconds to minutes and then resolve nonexertional.  Is been less in the last few weeks waiting for an office appointment.  She has had palpitation in the past never frequent severe sustained unrelated to this episode.  She has allergies and at times finds her self to cough and be short of breath but no exertional shortness of breath edema palpitation recently or syncope.  No history of heart disease congenital or rheumatic.  No fever chills. Past Medical History:  Diagnosis Date  . Depression   . Fibroids   . Migraines   . Prediabetes     History reviewed. No pertinent surgical history.  Current Medications: Current Meds  Medication Sig  . Cyanocobalamin (VITAMIN B 12 PO) Take by mouth.  . Ferrous Sulfate (IRON SUPPLEMENT PO) Take by mouth. daily  . VITAMIN D PO Take by mouth. Not lately     Allergies:   Other and Penicillins   Social History   Socioeconomic History  . Marital status: Married    Spouse name: Not on file  . Number of children: Not on file  . Years of education: Not on file  .  Highest education level: Not on file  Occupational History  . Not on file  Tobacco Use  . Smoking status: Never Smoker  . Smokeless tobacco: Never Used  Vaping Use  . Vaping Use: Never used  Substance and Sexual Activity  . Alcohol use: No  . Drug use: No  . Sexual activity: Yes    Birth control/protection: Condom  Other Topics Concern  . Not on file  Social History Narrative  . Not on file   Social Determinants of Health   Financial Resource Strain:   . Difficulty of Paying Living Expenses: Not on file  Food Insecurity:   . Worried About Charity fundraiser in  the Last Year: Not on file  . Ran Out of Food in the Last Year: Not on file  Transportation Needs:   . Lack of Transportation (Medical): Not on file  . Lack of Transportation (Non-Medical): Not on file  Physical Activity:   . Days of Exercise per Week: Not on file  . Minutes of Exercise per Session: Not on file  Stress:   . Feeling of Stress : Not on file  Social Connections:   . Frequency of Communication with Friends and Family: Not on file  . Frequency of Social Gatherings with Friends and Family: Not on file  . Attends Religious Services: Not on file  . Active Member of Clubs or Organizations: Not on file  . Attends Archivist Meetings: Not on file  . Marital Status: Not on file     Family History: The patient's family history includes Alzheimer's disease in her mother; Breast cancer in her sister; Diabetes in her father and sister; Heart failure in her sister; Hyperlipidemia in her sister; Hypertension in her father and sister; Kidney disease in her sister and sister; Peripheral Artery Disease in her sister.  ROS:   ROS Please see the history of present illness.     All other systems reviewed and are negative.  EKGs/Labs/Other Studies Reviewed:    The following studies were reviewed today:   EKG:  EKG is  ordered today.  The ekg ordered today is personally reviewed and demonstrates sinus rhythm left axis deviation otherwise normal  Recent Labs: 07/16/2020: BUN 7; Creatinine, Ser 0.60; Hemoglobin 13.3; Platelets 218; Potassium 4.1; Sodium 139; TSH 1.330  Recent Lipid Panel No results found for: CHOL, TRIG, HDL, CHOLHDL, VLDL, LDLCALC, LDLDIRECT  Physical Exam:    VS:  BP 128/78   Pulse 72   Ht 5' 7.5" (1.715 m)   Wt 181 lb (82.1 kg)   SpO2 96%   BMI 27.93 kg/m     Wt Readings from Last 3 Encounters:  08/12/20 181 lb (82.1 kg)  07/16/20 181 lb 9.6 oz (82.4 kg)  12/02/18 208 lb 3.2 oz (94.4 kg)     GEN:  Well nourished, well developed in no acute  distress HEENT: Normal NECK: No JVD; No carotid bruits LYMPHATICS: No lymphadenopathy CARDIAC: She has mild left costochondral tenderness does not reproduce her complaint RRR, no murmurs, rubs, gallops RESPIRATORY:  Clear to auscultation without rales, wheezing or rhonchi  ABDOMEN: Soft, non-tender, non-distended MUSCULOSKELETAL:  No edema; No deformity  SKIN: Warm and dry NEUROLOGIC:  Alert and oriented x 3 PSYCHIATRIC:  Normal affect     Signed, Shirlee More, MD  08/12/2020 11:39 AM    [place]

## 2020-08-12 ENCOUNTER — Encounter: Payer: Self-pay | Admitting: Cardiology

## 2020-08-12 ENCOUNTER — Other Ambulatory Visit: Payer: Self-pay

## 2020-08-12 ENCOUNTER — Ambulatory Visit (INDEPENDENT_AMBULATORY_CARE_PROVIDER_SITE_OTHER): Payer: 59 | Admitting: Cardiology

## 2020-08-12 VITALS — BP 128/78 | HR 72 | Ht 67.5 in | Wt 181.0 lb

## 2020-08-12 DIAGNOSIS — R002 Palpitations: Secondary | ICD-10-CM

## 2020-08-12 DIAGNOSIS — R079 Chest pain, unspecified: Secondary | ICD-10-CM | POA: Diagnosis not present

## 2020-08-12 NOTE — Patient Instructions (Addendum)
Medication Instructions:  Your physician recommends that you continue on your current medications as directed. Please refer to the Current Medication list given to you today.  *If you need a refill on your cardiac medications before your next appointment, please call your pharmacy*   Lab Work: None If you have labs (blood work) drawn today and your tests are completely normal, you will receive your results only by: . MyChart Message (if you have MyChart) OR . A paper copy in the mail If you have any lab test that is abnormal or we need to change your treatment, we will call you to review the results.   Testing/Procedures: Your physician has requested that you have an echocardiogram. Echocardiography is a painless test that uses sound waves to create images of your heart. It provides your doctor with information about the size and shape of your heart and how well your heart's chambers and valves are working. This procedure takes approximately one hour. There are no restrictions for this procedure.     Follow-Up: At [facility], you and your health needs are our priority.  As part of our continuing mission to provide you with exceptional heart care, we have created designated Provider Care Teams.  These Care Teams include your primary Cardiologist (physician) and Advanced Practice Providers (APPs -  Physician Assistants and Nurse Practitioners) who all work together to provide you with the care you need, when you need it.  We recommend signing up for the patient portal called "MyChart".  Sign up information is provided on this After Visit Summary.  MyChart is used to connect with patients for Virtual Visits (Telemedicine).  Patients are able to view lab/test results, encounter notes, upcoming appointments, etc.  Non-urgent messages can be sent to your provider as well.   To learn more about what you can do with MyChart, go to [email or web address].    Your next appointment:   As needed    The format for your next appointment:   In Person  Provider:   Brian Munley, MD   Other Instructions   

## 2020-08-13 ENCOUNTER — Ambulatory Visit: Payer: 59 | Admitting: Nurse Practitioner

## 2020-08-13 ENCOUNTER — Encounter: Payer: Self-pay | Admitting: Nurse Practitioner

## 2020-08-13 VITALS — BP 118/76 | HR 80 | Temp 98.0°F | Ht 66.2 in | Wt 179.6 lb

## 2020-08-13 DIAGNOSIS — Z0000 Encounter for general adult medical examination without abnormal findings: Secondary | ICD-10-CM | POA: Diagnosis not present

## 2020-08-13 DIAGNOSIS — G8929 Other chronic pain: Secondary | ICD-10-CM

## 2020-08-13 DIAGNOSIS — Z6828 Body mass index (BMI) 28.0-28.9, adult: Secondary | ICD-10-CM

## 2020-08-13 DIAGNOSIS — R0781 Pleurodynia: Secondary | ICD-10-CM

## 2020-08-13 DIAGNOSIS — H6123 Impacted cerumen, bilateral: Secondary | ICD-10-CM

## 2020-08-13 DIAGNOSIS — R7309 Other abnormal glucose: Secondary | ICD-10-CM | POA: Diagnosis not present

## 2020-08-13 DIAGNOSIS — M25561 Pain in right knee: Secondary | ICD-10-CM

## 2020-08-13 DIAGNOSIS — Z86018 Personal history of other benign neoplasm: Secondary | ICD-10-CM

## 2020-08-13 DIAGNOSIS — D509 Iron deficiency anemia, unspecified: Secondary | ICD-10-CM

## 2020-08-13 DIAGNOSIS — M25562 Pain in left knee: Secondary | ICD-10-CM

## 2020-08-13 NOTE — Patient Instructions (Signed)
Health Maintenance, Female Adopting a healthy lifestyle and getting preventive care are important in promoting health and wellness. Ask your health care provider about:  The right schedule for you to have regular tests and exams.  Things you can do on your own to prevent diseases and keep yourself healthy. What should I know about diet, weight, and exercise? Eat a healthy diet   Eat a diet that includes plenty of vegetables, fruits, low-fat dairy products, and lean protein.  Do not eat a lot of foods that are high in solid fats, added sugars, or sodium. Maintain a healthy weight Body mass index (BMI) is used to identify weight problems. It estimates body fat based on height and weight. Your health care provider can help determine your BMI and help you achieve or maintain a healthy weight. Get regular exercise Get regular exercise. This is one of the most important things you can do for your health. Most adults should:  Exercise for at least 150 minutes each week. The exercise should increase your heart rate and make you sweat (moderate-intensity exercise).  Do strengthening exercises at least twice a week. This is in addition to the moderate-intensity exercise.  Spend less time sitting. Even light physical activity can be beneficial. Watch cholesterol and blood lipids Have your blood tested for lipids and cholesterol at 45 years of age, then have this test every 5 years. Have your cholesterol levels checked more often if:  Your lipid or cholesterol levels are high.  You are older than 45 years of age.  You are at high risk for heart disease. What should I know about cancer screening? Depending on your health history and family history, you may need to have cancer screening at various ages. This may include screening for:  Breast cancer.  Cervical cancer.  Colorectal cancer.  Skin cancer.  Lung cancer. What should I know about heart disease, diabetes, and high blood  pressure? Blood pressure and heart disease  High blood pressure causes heart disease and increases the risk of stroke. This is more likely to develop in people who have high blood pressure readings, are of African descent, or are overweight.  Have your blood pressure checked: ? Every 3-5 years if you are 18-39 years of age. ? Every year if you are 40 years old or older. Diabetes Have regular diabetes screenings. This checks your fasting blood sugar level. Have the screening done:  Once every three years after age 40 if you are at a normal weight and have a low risk for diabetes.  More often and at a younger age if you are overweight or have a high risk for diabetes. What should I know about preventing infection? Hepatitis B If you have a higher risk for hepatitis B, you should be screened for this virus. Talk with your health care provider to find out if you are at risk for hepatitis B infection. Hepatitis C Testing is recommended for:  Everyone born from 1945 through 1965.  Anyone with known risk factors for hepatitis C. Sexually transmitted infections (STIs)  Get screened for STIs, including gonorrhea and chlamydia, if: ? You are sexually active and are younger than 45 years of age. ? You are older than 45 years of age and your health care provider tells you that you are at risk for this type of infection. ? Your sexual activity has changed since you were last screened, and you are at increased risk for chlamydia or gonorrhea. Ask your health care provider if   you are at risk.  Ask your health care provider about whether you are at high risk for HIV. Your health care provider may recommend a prescription medicine to help prevent HIV infection. If you choose to take medicine to prevent HIV, you should first get tested for HIV. You should then be tested every 3 months for as long as you are taking the medicine. Pregnancy  If you are about to stop having your period (premenopausal) and  you may become pregnant, seek counseling before you get pregnant.  Take 400 to 800 micrograms (mcg) of folic acid every day if you become pregnant.  Ask for birth control (contraception) if you want to prevent pregnancy. Osteoporosis and menopause Osteoporosis is a disease in which the bones lose minerals and strength with aging. This can result in bone fractures. If you are 65 years old or older, or if you are at risk for osteoporosis and fractures, ask your health care provider if you should:  Be screened for bone loss.  Take a calcium or vitamin D supplement to lower your risk of fractures.  Be given hormone replacement therapy (HRT) to treat symptoms of menopause. Follow these instructions at home: Lifestyle  Do not use any products that contain nicotine or tobacco, such as cigarettes, e-cigarettes, and chewing tobacco. If you need help quitting, ask your health care provider.  Do not use street drugs.  Do not share needles.  Ask your health care provider for help if you need support or information about quitting drugs. Alcohol use  Do not drink alcohol if: ? Your health care provider tells you not to drink. ? You are pregnant, may be pregnant, or are planning to become pregnant.  If you drink alcohol: ? Limit how much you use to 0-1 drink a day. ? Limit intake if you are breastfeeding.  Be aware of how much alcohol is in your drink. In the [place] one drink equals one 12 oz bottle of beer (355 mL), one 5 oz glass of wine (148 mL), or one 1 oz glass of hard liquor (44 mL). General instructions  Schedule regular health, dental, and eye exams.  Stay current with your vaccines.  Tell your health care provider if: ? You often feel depressed. ? You have ever been abused or do not feel safe at home. Summary  Adopting a healthy lifestyle and getting preventive care are important in promoting health and wellness.  Follow your health care provider's instructions about healthy  diet, exercising, and getting tested or screened for diseases.  Follow your health care provider's instructions on monitoring your cholesterol and blood pressure. This information is not intended to replace advice given to you by your health care provider. Make sure you discuss any questions you have with your health care provider. Document Revised: 11/23/2018 Document Reviewed: 11/23/2018 Elsevier Patient Education  2020 [employer]  

## 2020-08-13 NOTE — Progress Notes (Signed)
I,Yamilka Roman [employer] as a Education administrator for [employer], FNP.,have documented all relevant documentation on the behalf of Minette Brine, FNP,as directed by  Minette Brine, FNP while in the presence of Minette Brine, [place].  This visit occurred during the SARS-CoV-2 public health emergency.  Safety protocols were in place, including screening questions prior to the visit, additional usage of staff PPE, and extensive cleaning of exam room while observing appropriate contact time as indicated for disinfecting solutions.  Subjective:     Patient ID: Brooke Lozano , female    DOB: February 17, 1976 , 44 y.o.   MRN: [id number]   Chief Complaint  Patient presents with  . Annual Exam    HPI  Patient here for HM.  The last time she seen a GYN was a little over a year ago and she had a fibroid and cyst would like to be referred to another GYN for further evaluation.   Wt Readings from Last 3 Encounters: 08/13/20 : 179 lb 9.6 oz (81.5 kg) 08/12/20 : 181 lb (82.1 kg) 07/16/20 : 181 lb 9.6 oz (82.4 kg)  She reports she had a low vitamin B12 at a previous provider they wanted to do injections but she took oral vitamin B12.      Past Medical History:  Diagnosis Date  . Depression   . Fibroids   . Migraines   . Prediabetes      Family History  Problem Relation Age of Onset  . Alzheimer's disease Mother   . Diabetes Father   . Hypertension Father   . Kidney disease Sister   . Hypertension Sister   . Heart failure Sister   . Diabetes Sister   . Kidney disease Sister   . Breast cancer Sister   . Peripheral Artery Disease Sister   . Hyperlipidemia Sister      Current Outpatient Medications:  .  Cyanocobalamin (VITAMIN B 12 PO), Take by mouth., Disp: , Rfl:  .  Ferrous Sulfate (IRON SUPPLEMENT PO), Take by mouth. daily, Disp: , Rfl:  .  VITAMIN D PO, Take by mouth. Not lately, Disp: , Rfl:  .  Cholecalciferol (VITAMIN D-3) 125 MCG (5000 UT) TABS, Take 1 tablet by mouth daily., Disp: 30  tablet, Rfl: 1 .  eletriptan (RELPAX) 20 MG tablet, Take 1 tablet (20 mg total) by mouth once for 1 dose. May repeat in 2 hours if headache persists or recurs. (Patient not taking: Reported on 07/16/2020), Disp: 10 tablet, Rfl: 5   Allergies  Allergen Reactions  . Other     NAUSEA MEDICINE PRESCRIBED TO PREGNANT WOMEN-CAUSED SLURRED SPEECH  . Penicillins Nausea And Vomiting      The patient states she uses none for birth control. LMP - approximately one month ago.  It has been irregular and non existent for about one year. She does not have a GYN.  Negative for Dysmenorrhea and Negative for Menorrhagia. Negative for: breast discharge, breast lump(s), breast pain and breast self exam. Associated symptoms include abnormal vaginal bleeding. Pertinent negatives include abnormal bleeding (hematology), anxiety, decreased libido, depression, difficulty falling sleep, dyspareunia, history of infertility, nocturia, sexual dysfunction, sleep disturbances, urinary incontinence, urinary urgency, vaginal discharge and vaginal itching. Diet: mostly vegan not very strict occasional eating meat. The patient states her exercise level is moderate averaging 3 times a week for 30 minutes with cardio and strength training.   The patient's tobacco use is:  Social History   Tobacco Use  Smoking Status Never Smoker  Smokeless Tobacco Never  Used   She has been exposed to passive smoke. The patient's alcohol use is:  Social History   Substance and Sexual Activity  Alcohol Use No   Additional information: Last pap unknown.     Review of Systems  Constitutional: Negative.  Negative for fatigue.  HENT: Negative.   Eyes: Negative.   Respiratory: Negative.   Cardiovascular: Negative.  Negative for chest pain, palpitations and leg swelling.  Gastrointestinal: Negative.   Endocrine: Negative.   Genitourinary: Negative.   Musculoskeletal: Negative.        Bilateral knees have a tendency to "come out of socket"    Skin: Negative.   Allergic/Immunologic: Negative.   Neurological: Negative.   Hematological: Negative.   Psychiatric/Behavioral: Negative.      Today's Vitals   08/13/20 1416  BP: 118/76  Pulse: 80  Temp: 98 F (36.7 C)  TempSrc: Oral  Weight: 179 lb 9.6 oz (81.5 kg)  Height: 5' 6.2" (1.681 m)  PainSc: 0-No pain   Body mass index is 28.81 kg/m.   Objective:  Physical Exam Vitals reviewed.  Constitutional:      General: She is not in acute distress.    Appearance: Normal appearance. She is well-developed.  HENT:     Head: Normocephalic and atraumatic.     Right Ear: Hearing normal. There is impacted cerumen.     Left Ear: Hearing normal. There is impacted cerumen.     Nose:     Comments: Deferred - masked    Mouth/Throat:     Comments: Deferred - masked Eyes:     General: Lids are normal.     Extraocular Movements: Extraocular movements intact.     Conjunctiva/sclera: Conjunctivae normal.     Pupils: Pupils are equal, round, and reactive to light.     Funduscopic exam:    Right eye: No papilledema.        Left eye: No papilledema.  Neck:     Thyroid: No thyroid mass.     Vascular: No carotid bruit.  Cardiovascular:     Rate and Rhythm: Normal rate and regular rhythm.     Pulses: Normal pulses.     Heart sounds: Normal heart sounds. No murmur heard.   Pulmonary:     Effort: Pulmonary effort is normal.     Breath sounds: Normal breath sounds.  Abdominal:     General: Abdomen is flat. Bowel sounds are normal. There is no distension.     Palpations: Abdomen is soft.     Tenderness: There is no abdominal tenderness.  Musculoskeletal:        General: No swelling. Normal range of motion.     Cervical back: Full passive range of motion without pain, normal range of motion and neck supple.     Right lower leg: No edema.     Left lower leg: No edema.  Skin:    General: Skin is warm and dry.     Capillary Refill: Capillary refill takes less than 2 seconds.   Neurological:     General: No focal deficit present.     Mental Status: She is alert and oriented to person, place, and time.     Cranial Nerves: No cranial nerve deficit.     Sensory: No sensory deficit.  Psychiatric:        Mood and Affect: Mood normal.        Behavior: Behavior normal.        Thought Content: Thought content normal.  Judgment: Judgment normal.         Assessment And Plan:     1. Encounter for general adult medical examination w/o abnormal findings . Behavior modifications discussed and diet history reviewed.   . Pt will continue to exercise regularly and modify diet with low GI, plant based foods and decrease intake of processed foods.  . Recommend intake of daily multivitamin, Vitamin D, and calcium.  . Recommend mammogram for preventive screenings, as well as recommend immunizations that include influenza, TDAP - Lipid panel  2. History of uterine fibroid - Ambulatory referral to Obstetrics / Gynecology  3. Chronic pain of both knees  No abnormal findings on physical exam however she would like a referral to orthopedics for further evaluation - Ambulatory referral to Orthopedic Surgery - VITAMIN D 25 Hydroxy (Vit-D Deficiency, Fractures)  4. Abnormal glucose  Chronic, stable  no current medications  Encouraged to limit intake of sugary foods and drinks  Encouraged to increase physical activity to 150 minutes per week - Hemoglobin A1c  5. Iron deficiency anemia, unspecified iron deficiency anemia type  Will check vitamin B12 - Vitamin B12  6. Rib pain on right side  Mild tenderness to right rib area near 11/12  Will check rib xray  Discussed if has shortness of breath to seek medical a - DG Ribs Unilateral Right; Future  7. BMI 28.0-28.9,adult  8. Bilateral impacted cerumen  She did not have her ears flushed while here, will have her to return to have flushed.      Patient was given opportunity to ask questions. Patient  verbalized understanding of the plan and was able to repeat key elements of the plan. All questions were answered to their satisfaction.     Teola Bradley, FNP, have reviewed all documentation for this visit. The documentation on 08/27/20 for the exam, diagnosis, procedures, and orders are all accurate and complete.  THE PATIENT IS ENCOURAGED TO PRACTICE SOCIAL DISTANCING DUE TO THE COVID-19 PANDEMIC.

## 2020-08-14 LAB — HEMOGLOBIN A1C
Est. average glucose Bld gHb Est-mCnc: 114 mg/dL
Hgb A1c MFr Bld: 5.6 % (ref 4.8–5.6)

## 2020-08-14 LAB — LIPID PANEL
Chol/HDL Ratio: 3.3 ratio (ref 0.0–4.4)
Cholesterol, Total: 170 mg/dL (ref 100–199)
HDL: 51 mg/dL (ref >39)
LDL Chol Calc (NIH): 104 mg/dL — ABNORMAL HIGH (ref 0–99)
Triglycerides: 78 mg/dL (ref 0–149)
VLDL Cholesterol Cal: 15 mg/dL (ref 5–40)

## 2020-08-14 LAB — VITAMIN B12: Vitamin B-12: 491 pg/mL (ref 232–1245)

## 2020-08-14 LAB — VITAMIN D 25 HYDROXY (VIT D DEFICIENCY, FRACTURES): Vit D, 25-Hydroxy: 24.3 ng/mL — ABNORMAL LOW (ref 30.0–100.0)

## 2020-08-15 ENCOUNTER — Other Ambulatory Visit: Payer: Self-pay

## 2020-08-15 ENCOUNTER — Telehealth: Payer: Self-pay

## 2020-08-15 ENCOUNTER — Ambulatory Visit (HOSPITAL_COMMUNITY)
Admission: RE | Admit: 2020-08-15 | Discharge: 2020-08-15 | Disposition: A | Discharge status: Home / Self Care | Payer: 59 | Source: Ambulatory Visit | Attending: Cardiology | Admitting: Cardiology

## 2020-08-15 DIAGNOSIS — R079 Chest pain, unspecified: Secondary | ICD-10-CM | POA: Insufficient documentation

## 2020-08-15 LAB — ECHOCARDIOGRAM COMPLETE
Area-P 1/2: 1.96 cm2
S' Lateral: 3.3 cm

## 2020-08-15 NOTE — Telephone Encounter (Signed)
-----   Message from Richardo Priest, MD sent at 08/15/2020 12:56 PM EDT ----- Good result no injury from vaccine

## 2020-08-15 NOTE — Telephone Encounter (Signed)
Pt returning call

## 2020-08-15 NOTE — Telephone Encounter (Signed)
Left message on patients voicemail to please return our call.   

## 2020-08-15 NOTE — Progress Notes (Signed)
  Echocardiogram 2D Echocardiogram has been performed.  Brooke Lozano 08/15/2020, 12:38 PM

## 2020-08-16 ENCOUNTER — Other Ambulatory Visit (HOSPITAL_COMMUNITY): Payer: 59

## 2020-08-20 ENCOUNTER — Encounter: Payer: Self-pay | Admitting: Nurse Practitioner

## 2020-08-22 ENCOUNTER — Telehealth: Payer: Self-pay | Admitting: Nurse Practitioner

## 2020-08-22 MED ORDER — VITAMIN D (ERGOCALCIFEROL) 1.25 MG (50000 UNIT) PO CAPS: 50000.0000 [IU] | ORAL_CAPSULE | ORAL | 1 refills | Status: DC

## 2020-08-22 MED ORDER — VITAMIN D-3 125 MCG (5000 UT) PO TABS: 1.0000 | ORAL_TABLET | Freq: Every day | ORAL | 1 refills | Status: DC

## 2020-08-22 NOTE — Telephone Encounter (Signed)
Called patient to discuss question she sent in [place] in reference to xray vs ultrasound - would like to do xray first if negative will then order an ultrasound.  Also encouraged to call cardiology with any questions on why continues to have the chest pain/heart palpitations - she is encouraged to avoid caffeine and at times stress can cause these symptoms. Also, discussed lab results she will take 5,000 units of vitamin d daily until her next visit.  She will come in on Tuesday to have her ears flushed since we did not do them when she was here for her visit.

## 2020-08-30 LAB — HM PAP SMEAR

## 2020-09-09 ENCOUNTER — Ambulatory Visit
Admission: RE | Admit: 2020-09-09 | Discharge: 2020-09-09 | Disposition: A | Discharge status: Home / Self Care | Payer: 59 | Source: Ambulatory Visit | Attending: Nurse Practitioner | Admitting: Nurse Practitioner

## 2020-09-09 ENCOUNTER — Other Ambulatory Visit: Payer: Self-pay | Admitting: Nurse Practitioner

## 2020-09-09 ENCOUNTER — Other Ambulatory Visit: Payer: Self-pay

## 2020-09-09 DIAGNOSIS — R0781 Pleurodynia: Secondary | ICD-10-CM

## 2020-09-10 ENCOUNTER — Other Ambulatory Visit: Payer: Self-pay | Admitting: Nurse Practitioner

## 2020-09-10 ENCOUNTER — Other Ambulatory Visit: Payer: Self-pay

## 2020-09-10 DIAGNOSIS — R1011 Right upper quadrant pain: Secondary | ICD-10-CM

## 2020-09-10 NOTE — Progress Notes (Signed)
I would recommend trying the medications to see if this helps otherwise I would be more than happy to refer to GI

## 2020-09-26 LAB — HM MAMMOGRAPHY: HM Mammogram: NORMAL (ref 0–4)

## 2020-11-04 ENCOUNTER — Other Ambulatory Visit: Payer: Self-pay

## 2020-11-04 DIAGNOSIS — G43019 Migraine without aura, intractable, without status migrainosus: Secondary | ICD-10-CM

## 2020-11-04 MED ORDER — ELETRIPTAN HYDROBROMIDE 20 MG PO TABS: 20.0000 mg | ORAL_TABLET | Freq: Once | ORAL | 2 refills | Status: DC

## 2020-12-04 ENCOUNTER — Telehealth: Payer: Self-pay

## 2020-12-04 ENCOUNTER — Encounter: Payer: Self-pay | Admitting: Nurse Practitioner

## 2020-12-04 NOTE — Telephone Encounter (Signed)
Spoke with patient about medication  

## 2021-01-09 ENCOUNTER — Other Ambulatory Visit: Payer: Self-pay | Admitting: Obstetrics and Gynecology

## 2021-01-09 DIAGNOSIS — R928 Other abnormal and inconclusive findings on diagnostic imaging of breast: Secondary | ICD-10-CM

## 2021-02-05 ENCOUNTER — Encounter: Payer: Self-pay | Admitting: Nurse Practitioner

## 2021-02-06 ENCOUNTER — Encounter: Payer: Self-pay | Admitting: Nurse Practitioner

## 2021-02-06 ENCOUNTER — Ambulatory Visit (INDEPENDENT_AMBULATORY_CARE_PROVIDER_SITE_OTHER): Payer: Managed Care, Other (non HMO) | Admitting: Nurse Practitioner

## 2021-02-06 ENCOUNTER — Other Ambulatory Visit: Payer: Self-pay

## 2021-02-06 VITALS — BP 116/64 | HR 69 | Temp 98.2°F | Ht 66.2 in | Wt 180.2 lb

## 2021-02-06 DIAGNOSIS — F331 Major depressive disorder, recurrent, moderate: Secondary | ICD-10-CM

## 2021-02-06 DIAGNOSIS — M79604 Pain in right leg: Secondary | ICD-10-CM | POA: Diagnosis not present

## 2021-02-06 DIAGNOSIS — R202 Paresthesia of skin: Secondary | ICD-10-CM | POA: Diagnosis not present

## 2021-02-06 DIAGNOSIS — D509 Iron deficiency anemia, unspecified: Secondary | ICD-10-CM

## 2021-02-06 DIAGNOSIS — E559 Vitamin D deficiency, unspecified: Secondary | ICD-10-CM

## 2021-02-06 NOTE — Progress Notes (Signed)
I,Yamilka Roman [employer] as a Education administrator for [employer], FNP.,have documented all relevant documentation on the behalf of Minette Brine, FNP,as directed by  Minette Brine, FNP while in the presence of Minette Brine, [place]. This visit occurred during the SARS-CoV-2 public health emergency.  Safety protocols were in place, including screening questions prior to the visit, additional usage of staff PPE, and extensive cleaning of exam room while observing appropriate contact time as indicated for disinfecting solutions.  Subjective:     Patient ID: Brooke Lozano , female    DOB: 1976/02/24 , 45 y.o.   MRN: [id number]   Chief Complaint  Patient presents with  . Leg Pain    Patient presents today for right leg pain and burning. She stated she it started a few days ago she reports her knee started to swell.   . iron f/u    HPI  Patient presents today for a f/u on her iron. She also reports having right leg and knee pain. Feels like touching the skin she is having pain.  Does have low back pain as well. Does have a history of knee problems. She did go to an orthopedic for knee problems and was to call if has   Wt Readings from Last 3 Encounters: 02/06/21 : 180 lb 3.2 oz (81.7 kg) 08/13/20 : 179 lb 9.6 oz (81.5 kg) 08/12/20 : 181 lb (82.1 kg)   Leg Pain  The incident occurred more than 1 week ago (2 weeks ago). The pain is present in the right leg. The quality of the pain is described as burning and stabbing. The pain is at a severity of 10/10. The pain is severe. The pain has been intermittent (when she touches the area) since onset. Pertinent negatives include no inability to bear weight, muscle weakness, numbness or tingling. She has tried nothing for the symptoms.     Past Medical History:  Diagnosis Date  . Depression   . Fibroids   . Migraines   . Prediabetes      Family History  Problem Relation Age of Onset  . Alzheimer's disease Mother   . Diabetes Father   . Hypertension  Father   . Kidney disease Sister   . Hypertension Sister   . Heart failure Sister   . Diabetes Sister   . Kidney disease Sister   . Breast cancer Sister   . Peripheral Artery Disease Sister   . Hyperlipidemia Sister      Current Outpatient Medications:  .  Cholecalciferol (VITAMIN D-3) 125 MCG (5000 UT) TABS, Take 1 tablet by mouth daily., Disp: 30 tablet, Rfl: 1 .  Cyanocobalamin (VITAMIN B 12 PO), Take by mouth., Disp: , Rfl:  .  eletriptan (RELPAX) 20 MG tablet, Take 1 tablet (20 mg total) by mouth once for 1 dose. May repeat in 2 hours if headache persists or recurs., Disp: 10 tablet, Rfl: 2 .  Ferrous Sulfate (IRON SUPPLEMENT PO), Take by mouth. daily, Disp: , Rfl:    Allergies  Allergen Reactions  . Other     NAUSEA MEDICINE PRESCRIBED TO PREGNANT WOMEN-CAUSED SLURRED SPEECH  . Penicillins Nausea And Vomiting     Review of Systems  Constitutional: Negative.   HENT: Negative.   Eyes: Negative.   Respiratory: Negative.  Negative for cough, wheezing and stridor.   Cardiovascular: Positive for leg swelling (right knee).  Endocrine: Negative.   Genitourinary: Negative.   Musculoskeletal: Negative.   Skin: Negative.   Neurological: Negative for dizziness, tingling and numbness.  Hematological: Negative.   Psychiatric/Behavioral: Negative.      Today's Vitals   02/06/21 1143  BP: 116/64  Pulse: 69  Temp: 98.2 F (36.8 C)  TempSrc: Oral  Weight: 180 lb 3.2 oz (81.7 kg)  Height: 5' 6.2" (1.681 m)  PainSc: 3   PainLoc: Leg   Body mass index is 28.91 kg/m.   Objective:  Physical Exam Constitutional:      General: She is not in acute distress.    Appearance: Normal appearance. She is normal weight.  Cardiovascular:     Rate and Rhythm: Normal rate and regular rhythm.     Pulses: Normal pulses.     Heart sounds: Normal heart sounds. No murmur heard.   Pulmonary:     Effort: Pulmonary effort is normal. No respiratory distress.     Breath sounds: Normal  breath sounds. No wheezing.  Abdominal:     General: Abdomen is flat. Bowel sounds are normal.     Palpations: Abdomen is soft.  Musculoskeletal:        General: Swelling present.     Cervical back: Normal range of motion.  Skin:    General: Skin is warm and dry.     Capillary Refill: Capillary refill takes less than 2 seconds.  Neurological:     General: No focal deficit present.     Mental Status: She is alert and oriented to person, place, and time.  Psychiatric:        Mood and Affect: Mood normal.        Behavior: Behavior normal.        Thought Content: Thought content normal.        Judgment: Judgment normal.         Assessment And Plan:     1. Iron deficiency anemia, unspecified iron deficiency anemia type  Chronic, continues with iron supplement  Will check levels today - Iron, TIBC and Ferritin Panel  2. Tingling in extremities  Chronic, will check metabolic causes  Also discussed meralgia parasthetica as a low chance - TSH - Vitamin B12 - VAS [place] LOWER EXTREMITY VENOUS (DVT); Future  3. Right leg pain  Will check venous doppler to check to evaluate for clot due to new onset leg pain - VAS [place] LOWER EXTREMITY VENOUS (DVT); Future  4. Moderate episode of recurrent major depressive disorder (HCC)  Chronic, she is more feelings of being down  She is interested in counseling but not at this time  5. Vitamin D deficiency  Will check vitamin D level and supplement as needed.     Also encouraged to spend 15 minutes in the sun daily.  - VITAMIN D 25 Hydroxy (Vit-D Deficiency, Fractures)     Patient was given opportunity to ask questions. Patient verbalized understanding of the plan and was able to repeat key elements of the plan. All questions were answered to their satisfaction.  Minette Brine, FNP   I, Minette Brine, FNP, have reviewed all documentation for this visit. The documentation on 02/06/21 for the exam, diagnosis, procedures, and orders are all  accurate and complete.   THE PATIENT IS ENCOURAGED TO PRACTICE SOCIAL DISTANCING DUE TO THE COVID-19 PANDEMIC.

## 2021-02-06 NOTE — Patient Instructions (Signed)
Take naproxen two times a day for 5 days if not better return call to office may need a steroid dose pack

## 2021-02-07 LAB — TSH: TSH: 0.792 u[IU]/mL (ref 0.450–4.500)

## 2021-02-07 LAB — IRON,TIBC AND FERRITIN PANEL
Ferritin: 19 ng/mL (ref 15–150)
Iron Saturation: 30 % (ref 15–55)
Iron: 99 ug/dL (ref 27–159)
Total Iron Binding Capacity: 331 ug/dL (ref 250–450)
UIBC: 232 ug/dL (ref 131–425)

## 2021-02-07 LAB — VITAMIN B12: Vitamin B-12: 604 pg/mL (ref 232–1245)

## 2021-02-07 LAB — VITAMIN D 25 HYDROXY (VIT D DEFICIENCY, FRACTURES): Vit D, 25-Hydroxy: 35.1 ng/mL (ref 30.0–100.0)

## 2021-02-11 ENCOUNTER — Ambulatory Visit: Payer: 59 | Admitting: Nurse Practitioner

## 2021-02-11 ENCOUNTER — Other Ambulatory Visit: Payer: Self-pay

## 2021-02-11 ENCOUNTER — Ambulatory Visit (HOSPITAL_COMMUNITY)
Admission: RE | Admit: 2021-02-11 | Discharge: 2021-02-11 | Disposition: A | Discharge status: Home / Self Care | Payer: Managed Care, Other (non HMO) | Source: Ambulatory Visit | Attending: Cardiology | Admitting: Cardiology

## 2021-02-11 DIAGNOSIS — R202 Paresthesia of skin: Secondary | ICD-10-CM | POA: Insufficient documentation

## 2021-02-11 DIAGNOSIS — M79604 Pain in right leg: Secondary | ICD-10-CM | POA: Insufficient documentation

## 2021-02-16 ENCOUNTER — Other Ambulatory Visit: Payer: Self-pay | Admitting: Nurse Practitioner

## 2021-02-16 DIAGNOSIS — M79604 Pain in right leg: Secondary | ICD-10-CM

## 2021-02-16 DIAGNOSIS — R202 Paresthesia of skin: Secondary | ICD-10-CM

## 2021-02-16 NOTE — Progress Notes (Signed)
She needs to continue taking all of her vitamins the levels are likely normal because she is taking the vitamins.

## 2021-02-18 ENCOUNTER — Other Ambulatory Visit: Payer: Self-pay | Admitting: Nurse Practitioner

## 2021-02-18 DIAGNOSIS — M79604 Pain in right leg: Secondary | ICD-10-CM

## 2021-02-18 DIAGNOSIS — R202 Paresthesia of skin: Secondary | ICD-10-CM

## 2021-02-18 NOTE — Progress Notes (Signed)
I have already placed an order with ortho but I will change it to Neurology in case they do an EMG/NCV

## 2021-04-21 ENCOUNTER — Ambulatory Visit: Payer: Managed Care, Other (non HMO) | Admitting: Neurology

## 2021-04-21 ENCOUNTER — Encounter: Payer: Self-pay | Admitting: Neurology

## 2021-04-21 ENCOUNTER — Telehealth: Payer: Self-pay | Admitting: Neurology

## 2021-04-21 NOTE — Telephone Encounter (Signed)
This patient did not show up for a new patient appointment today.

## 2021-08-20 ENCOUNTER — Encounter: Payer: 59 | Admitting: Nurse Practitioner

## 2021-12-02 ENCOUNTER — Encounter: Payer: Self-pay | Admitting: Nurse Practitioner

## 2021-12-10 ENCOUNTER — Other Ambulatory Visit: Payer: Self-pay

## 2021-12-10 ENCOUNTER — Encounter: Payer: Self-pay | Admitting: Nurse Practitioner

## 2021-12-10 ENCOUNTER — Ambulatory Visit: Payer: Managed Care, Other (non HMO) | Admitting: Nurse Practitioner

## 2021-12-10 VITALS — BP 118/82 | HR 94 | Temp 98.9°F | Ht 66.0 in | Wt 187.4 lb

## 2021-12-10 DIAGNOSIS — F332 Major depressive disorder, recurrent severe without psychotic features: Secondary | ICD-10-CM | POA: Diagnosis not present

## 2021-12-10 DIAGNOSIS — R0781 Pleurodynia: Secondary | ICD-10-CM | POA: Diagnosis not present

## 2021-12-10 NOTE — Progress Notes (Signed)
I,Victoria T Hamilton,acting as a Education administrator for Minette Brine, FNP.,have documented all relevant documentation on the behalf of Minette Brine, FNP,as directed by  Minette Brine, FNP while in the presence of Minette Brine, [place].   This visit occurred during the SARS-CoV-2 public health emergency.  Safety protocols were in place, including screening questions prior to the visit, additional usage of staff PPE, and extensive cleaning of exam room while observing appropriate contact time as indicated for disinfecting solutions.  Subjective:     Patient ID: Brooke Lozano , female    DOB: 08/14/1976 , 45 y.o.   MRN: [id number]   Chief Complaint  Patient presents with   Mass    HPI  Pt reports a lump under her right rib. She experiences pressure & discomfort. Pt reports the " lump" getting bigger overtime. She has a lot of pressure and discomfort. Denies the area has gotten any better during the last few months.     Past Medical History:  Diagnosis Date   Depression    Fibroids    Migraines    Prediabetes      Family History  Problem Relation Age of Onset   Alzheimer's disease Mother    Diabetes Father    Hypertension Father    Kidney disease Sister    Hypertension Sister    Heart failure Sister    Diabetes Sister    Kidney disease Sister    Breast cancer Sister    Peripheral Artery Disease Sister    Hyperlipidemia Sister      Current Outpatient Medications:    Cholecalciferol (VITAMIN D-3) 125 MCG (5000 UT) TABS, Take 1 tablet by mouth daily. (Patient not taking: Reported on 12/10/2021), Disp: 30 tablet, Rfl: 1   Cyanocobalamin (VITAMIN B 12 PO), Take by mouth. (Patient not taking: Reported on 12/10/2021), Disp: , Rfl:    eletriptan (RELPAX) 20 MG tablet, Take 1 tablet (20 mg total) by mouth once for 1 dose. May repeat in 2 hours if headache persists or recurs., Disp: 10 tablet, Rfl: 2   Ferrous Sulfate (IRON SUPPLEMENT PO), Take by mouth. daily (Patient not taking: Reported on  12/10/2021), Disp: , Rfl:    Allergies  Allergen Reactions   Other     NAUSEA MEDICINE PRESCRIBED TO PREGNANT WOMEN-CAUSED SLURRED SPEECH   Penicillins Nausea And Vomiting     Review of Systems  Constitutional: Negative.   Respiratory: Negative.    Cardiovascular: Negative.  Negative for chest pain, palpitations and leg swelling.  Gastrointestinal:  Positive for constipation (no medications - she had a bowel movement last week 3 times).  Neurological: Negative.   Psychiatric/Behavioral: Negative.      Today's Vitals   12/10/21 1021  BP: 118/82  Pulse: 94  Temp: 98.9 F (37.2 C)  Weight: 187 lb 6.4 oz (85 kg)  Height: 5\' 6"  (1.676 m)   Body mass index is 30.25 kg/m.  Wt Readings from Last 3 Encounters:  12/10/21 187 lb 6.4 oz (85 kg)  02/06/21 180 lb 3.2 oz (81.7 kg)  08/13/20 179 lb 9.6 oz (81.5 kg)    Objective:  Physical Exam Vitals reviewed.  Constitutional:      General: She is not in acute distress.    Appearance: Normal appearance. She is normal weight.  Cardiovascular:     Rate and Rhythm: Normal rate and regular rhythm.     Pulses: Normal pulses.     Heart sounds: Normal heart sounds. No murmur heard. Pulmonary:     Effort:  Pulmonary effort is normal. No respiratory distress.     Breath sounds: Normal breath sounds. No wheezing.  Abdominal:     General: Abdomen is flat. Bowel sounds are normal.     Palpations: Abdomen is soft.  Musculoskeletal:        General: Tenderness (right lower rib area) present. No swelling.     Cervical back: Normal range of motion.  Skin:    General: Skin is warm and dry.     Capillary Refill: Capillary refill takes less than 2 seconds.  Neurological:     General: No focal deficit present.     Mental Status: She is alert and oriented to person, place, and time.  Psychiatric:        Mood and Affect: Mood normal.        Behavior: Behavior normal.        Thought Content: Thought content normal.        Judgment: Judgment  normal.        Assessment And Plan:     1. Rib pain on right side Comments: She has a firm area noted to right side rib area, will check ultrasound - [place] CHEST SOFT TISSUE; Future  2. Severe episode of recurrent major depressive disorder, without psychotic features ([place]) Comments: She declines doing a depression screen today but would like to see a counselor - Ambulatory referral to Psychology     Patient was given opportunity to ask questions. Patient verbalized understanding of the plan and was able to repeat key elements of the plan. All questions were answered to their satisfaction.  Minette Brine, FNP   I, Minette Brine, FNP, have reviewed all documentation for this visit. The documentation on 12/10/21 for the exam, diagnosis, procedures, and orders are all accurate and complete.   IF YOU HAVE BEEN REFERRED TO A SPECIALIST, IT MAY TAKE 1-2 WEEKS TO SCHEDULE/PROCESS THE REFERRAL. IF YOU HAVE NOT HEARD FROM US/SPECIALIST IN TWO WEEKS, PLEASE GIVE [place] A CALL AT [phone].   THE PATIENT IS ENCOURAGED TO PRACTICE SOCIAL DISTANCING DUE TO THE COVID-19 PANDEMIC.

## 2021-12-12 ENCOUNTER — Ambulatory Visit
Admission: RE | Admit: 2021-12-12 | Discharge: 2021-12-12 | Disposition: A | Discharge status: Home / Self Care | Payer: Managed Care, Other (non HMO) | Source: Ambulatory Visit | Attending: Nurse Practitioner | Admitting: Nurse Practitioner

## 2021-12-12 DIAGNOSIS — R0781 Pleurodynia: Secondary | ICD-10-CM

## 2021-12-17 ENCOUNTER — Other Ambulatory Visit: Payer: Self-pay | Admitting: Nurse Practitioner

## 2021-12-17 DIAGNOSIS — R9389 Abnormal findings on diagnostic imaging of other specified body structures: Secondary | ICD-10-CM

## 2021-12-17 DIAGNOSIS — R0781 Pleurodynia: Secondary | ICD-10-CM

## 2021-12-21 ENCOUNTER — Encounter: Payer: Self-pay | Admitting: Nurse Practitioner

## 2021-12-24 ENCOUNTER — Encounter: Payer: Self-pay | Admitting: Nurse Practitioner

## 2022-01-01 ENCOUNTER — Other Ambulatory Visit: Payer: Self-pay | Admitting: Nurse Practitioner

## 2022-01-01 DIAGNOSIS — R0781 Pleurodynia: Secondary | ICD-10-CM

## 2022-01-01 DIAGNOSIS — R9389 Abnormal findings on diagnostic imaging of other specified body structures: Secondary | ICD-10-CM

## 2022-01-08 ENCOUNTER — Ambulatory Visit
Admission: RE | Admit: 2022-01-08 | Discharge: 2022-01-08 | Disposition: A | Discharge status: Home / Self Care | Payer: Managed Care, Other (non HMO) | Source: Ambulatory Visit | Attending: Nurse Practitioner | Admitting: Nurse Practitioner

## 2022-01-08 DIAGNOSIS — R0781 Pleurodynia: Secondary | ICD-10-CM

## 2022-01-08 DIAGNOSIS — R9389 Abnormal findings on diagnostic imaging of other specified body structures: Secondary | ICD-10-CM

## 2022-01-29 ENCOUNTER — Telehealth: Payer: Self-pay | Admitting: Nurse Practitioner

## 2022-01-29 DIAGNOSIS — E2839 Other primary ovarian failure: Secondary | ICD-10-CM

## 2022-01-29 DIAGNOSIS — R0781 Pleurodynia: Secondary | ICD-10-CM

## 2022-01-29 NOTE — Telephone Encounter (Signed)
Called to

## 2022-05-20 ENCOUNTER — Encounter: Payer: Self-pay | Admitting: Nurse Practitioner

## 2022-05-20 ENCOUNTER — Ambulatory Visit (INDEPENDENT_AMBULATORY_CARE_PROVIDER_SITE_OTHER): Payer: Managed Care, Other (non HMO) | Admitting: Nurse Practitioner

## 2022-05-20 VITALS — BP 110/62 | HR 69 | Temp 98.1°F | Ht 66.0 in | Wt 193.0 lb

## 2022-05-20 DIAGNOSIS — Z0000 Encounter for general adult medical examination without abnormal findings: Secondary | ICD-10-CM

## 2022-05-20 DIAGNOSIS — G8929 Other chronic pain: Secondary | ICD-10-CM

## 2022-05-20 DIAGNOSIS — E559 Vitamin D deficiency, unspecified: Secondary | ICD-10-CM

## 2022-05-20 DIAGNOSIS — Z23 Encounter for immunization: Secondary | ICD-10-CM | POA: Diagnosis not present

## 2022-05-20 DIAGNOSIS — G43019 Migraine without aura, intractable, without status migrainosus: Secondary | ICD-10-CM

## 2022-05-20 DIAGNOSIS — E6609 Other obesity due to excess calories: Secondary | ICD-10-CM

## 2022-05-20 DIAGNOSIS — R7309 Other abnormal glucose: Secondary | ICD-10-CM

## 2022-05-20 DIAGNOSIS — G43009 Migraine without aura, not intractable, without status migrainosus: Secondary | ICD-10-CM | POA: Diagnosis not present

## 2022-05-20 DIAGNOSIS — M25562 Pain in left knee: Secondary | ICD-10-CM

## 2022-05-20 DIAGNOSIS — D509 Iron deficiency anemia, unspecified: Secondary | ICD-10-CM

## 2022-05-20 DIAGNOSIS — M25561 Pain in right knee: Secondary | ICD-10-CM

## 2022-05-20 DIAGNOSIS — Z1211 Encounter for screening for malignant neoplasm of colon: Secondary | ICD-10-CM

## 2022-05-20 DIAGNOSIS — Z6831 Body mass index (BMI) 31.0-31.9, adult: Secondary | ICD-10-CM

## 2022-05-20 DIAGNOSIS — Z114 Encounter for screening for human immunodeficiency virus [HIV]: Secondary | ICD-10-CM

## 2022-05-20 MED ORDER — ELETRIPTAN HYDROBROMIDE 20 MG PO TABS: ORAL_TABLET | ORAL | 2 refills | Status: DC

## 2022-05-20 NOTE — Progress Notes (Signed)
I,Tianna Badgett,acting as a Education administrator for [employer], FNP.,have documented all relevant documentation on the behalf of Minette Brine, FNP,as directed by  Minette Brine, FNP while in the presence of Minette Brine, [place].  This visit occurred during the SARS-CoV-2 public health emergency.  Safety protocols were in place, including screening questions prior to the visit, additional usage of staff PPE, and extensive cleaning of exam room while observing appropriate contact time as indicated for disinfecting solutions.  Subjective:     Patient ID: Brooke Lozano , female    DOB: January 08, 1976 , 46 y.o.   MRN: [id number]   Chief Complaint  Patient presents with   Annual Exam    HPI  Patient here for HM.  She has not seen GYN lately - has seen one in the past. She was seeing Dr. Charlesetta Garibaldi     Past Medical History:  Diagnosis Date   Depression    Fibroids    Migraines    Prediabetes      Family History  Problem Relation Age of Onset   Alzheimer's disease Mother    Diabetes Father    Hypertension Father    Kidney disease Sister    Hypertension Sister    Heart failure Sister    Diabetes Sister    Kidney disease Sister    Breast cancer Sister    Peripheral Artery Disease Sister    Hyperlipidemia Sister      Current Outpatient Medications:    eletriptan (RELPAX) 20 MG tablet, Take 1 tablet by mouth at onset of headache. May repeat in 2 hours if headache persists or recurs, Disp: 10 tablet, Rfl: 2   Vitamin D, Ergocalciferol, (DRISDOL) 1.25 MG (50000 UNIT) CAPS capsule, Take 1 capsule (50,000 Units total) by mouth every 7 (seven) days., Disp: 12 capsule, Rfl: 1   Allergies  Allergen Reactions   Other     NAUSEA MEDICINE PRESCRIBED TO PREGNANT WOMEN-CAUSED SLURRED SPEECH   Penicillins Nausea And Vomiting      The patient states she is perimenopause.  Negative for Dysmenorrhea and Negative for Menorrhagia. Negative for: breast discharge, breast lump(s), breast pain and breast self  exam. Associated symptoms include abnormal vaginal bleeding. Pertinent negatives include abnormal bleeding (hematology), anxiety, decreased libido, depression, difficulty falling sleep, dyspareunia, history of infertility, nocturia, sexual dysfunction, sleep disturbances, urinary incontinence, urinary urgency, vaginal discharge and vaginal itching. Diet regular, she was doing Keto but has been having challenges lately. The patient states her exercise level is minimal - admits to not being consistent with her exercise.  The patient's tobacco use is:  Social History   Tobacco Use  Smoking Status Never  Smokeless Tobacco Never   She has been exposed to passive smoke. The patient's alcohol use is:  Social History   Substance and Sexual Activity  Alcohol Use No  . Additional information: Last pap more than 3 years ago, she will follow up with Dr. Charlesetta Garibaldi.  Review of Systems  Constitutional: Negative.   HENT: Negative.    Eyes: Negative.   Respiratory: Negative.    Cardiovascular: Negative.   Gastrointestinal: Negative.   Endocrine: Negative.   Genitourinary: Negative.   Musculoskeletal: Negative.   Skin: Negative.   Allergic/Immunologic: Negative.   Neurological:  Positive for headaches.  Hematological: Negative.   Psychiatric/Behavioral: Negative.       Today's Vitals   05/20/22 1011  BP: 110/62  Pulse: 69  Temp: 98.1 F (36.7 C)  TempSrc: Oral  Weight: 193 lb (87.5 kg)  Height: 5'  6" (1.676 m)   Body mass index is 31.15 kg/m.  Wt Readings from Last 3 Encounters:  05/20/22 193 lb (87.5 kg)  12/10/21 187 lb 6.4 oz (85 kg)  02/06/21 180 lb 3.2 oz (81.7 kg)    Objective:  Physical Exam Vitals reviewed.  Constitutional:      General: She is not in acute distress.    Appearance: Normal appearance. She is well-developed.  HENT:     Head: Normocephalic and atraumatic.     Right Ear: Hearing normal. There is impacted cerumen.     Left Ear: Hearing normal. There is  impacted cerumen.     Nose:     Comments: Deferred - masked    Mouth/Throat:     Comments: Deferred - masked Eyes:     General: Lids are normal.     Extraocular Movements: Extraocular movements intact.     Conjunctiva/sclera: Conjunctivae normal.     Pupils: Pupils are equal, round, and reactive to light.     Funduscopic exam:    Right eye: No papilledema.        Left eye: No papilledema.  Neck:     Thyroid: No thyroid mass.     Vascular: No carotid bruit.  Cardiovascular:     Rate and Rhythm: Normal rate and regular rhythm.     Pulses: Normal pulses.     Heart sounds: Normal heart sounds. No murmur heard. Pulmonary:     Effort: Pulmonary effort is normal.     Breath sounds: Normal breath sounds.  Abdominal:     General: Abdomen is flat. Bowel sounds are normal. There is no distension.     Palpations: Abdomen is soft.     Tenderness: There is no abdominal tenderness.  Musculoskeletal:        General: No swelling. Normal range of motion.     Cervical back: Full passive range of motion without pain, normal range of motion and neck supple.     Right lower leg: No edema.     Left lower leg: No edema.  Skin:    General: Skin is warm and dry.     Capillary Refill: Capillary refill takes less than 2 seconds.  Neurological:     General: No focal deficit present.     Mental Status: She is alert and oriented to person, place, and time.     Cranial Nerves: No cranial nerve deficit.     Sensory: No sensory deficit.  Psychiatric:        Mood and Affect: Mood normal.        Behavior: Behavior normal.        Thought Content: Thought content normal.        Judgment: Judgment normal.         Assessment And Plan:     1. Encounter for general adult medical examination w/o abnormal findings Behavior modifications discussed and diet history reviewed.   Pt will continue to exercise regularly and modify diet with low GI, plant based foods and decrease intake of processed foods.   Recommend intake of daily multivitamin, Vitamin D, and calcium.  Recommend mammogram and colonoscopy/cologuard for preventive screenings, as well as recommend immunizations that include influenza, TDAP - CMP14+EGFR - Lipid panel  2. Class 1 obesity due to excess calories without serious comorbidity with body mass index (BMI) of 31.0 to 31.9 in adult She is encouraged to strive for BMI less than 30 to decrease cardiac risk. Advised to aim for at least  150 minutes of exercise per week.  3. Screening for HIV without presence of risk factors - HIV Antibody (routine testing w rflx)  4. Colon cancer screening According to USPTF Colorectal cancer Screening guidelines. Cologuard is recommended every 3 years, starting at age 31 years. Will refer to GI for colon cancer screening. - Cologuard  5. Need for Tdap vaccination Will give tetanus vaccine today while in office. Refer to order management. TDAP will be administered to adults 77-77 years old every 10 years. - Tdap vaccine greater than or equal to 7yo IM  6. Vitamin D deficiency Will check vitamin D level and supplement as needed.    Also encouraged to spend 15 minutes in the sun daily.  - VITAMIN D 25 Hydroxy (Vit-D Deficiency, Fractures)  7. Abnormal glucose Comments: Stable, diet controlled. Continue healthy diet and encouraged to increase physical activty - Hemoglobin A1c  8. Migraine without aura and without status migrainosus, not intractable Comments: Continue Relpax as needed - eletriptan (RELPAX) 20 MG tablet; Take 1 tablet by mouth at onset of headache. May repeat in 2 hours if headache persists or recurs  Dispense: 10 tablet; Refill: 2  9. Iron deficiency anemia, unspecified iron deficiency anemia type - CBC  10. Chronic pain of both knees - VITAMIN D 25 Hydroxy (Vit-D Deficiency, Fractures)      Patient was given opportunity to ask questions. Patient verbalized understanding of the plan and was able to repeat key  elements of the plan. All questions were answered to their satisfaction.   Minette Brine, FNP   I, Minette Brine, FNP, have reviewed all documentation for this visit. The documentation on 05/31/22 for the exam, diagnosis, procedures, and orders are all accurate and complete.  THE PATIENT IS ENCOURAGED TO PRACTICE SOCIAL DISTANCING DUE TO THE COVID-19 PANDEMIC.

## 2022-05-20 NOTE — Patient Instructions (Addendum)

## 2022-05-21 ENCOUNTER — Other Ambulatory Visit: Payer: Self-pay | Admitting: Nurse Practitioner

## 2022-05-21 DIAGNOSIS — E559 Vitamin D deficiency, unspecified: Secondary | ICD-10-CM

## 2022-05-21 LAB — LIPID PANEL
Chol/HDL Ratio: 3.1 ratio (ref 0.0–4.4)
Cholesterol, Total: 151 mg/dL (ref 100–199)
HDL: 49 mg/dL (ref >39)
LDL Chol Calc (NIH): 81 mg/dL (ref 0–99)
Triglycerides: 114 mg/dL (ref 0–149)
VLDL Cholesterol Cal: 21 mg/dL (ref 5–40)

## 2022-05-21 LAB — CMP14+EGFR
ALT: 13 IU/L (ref 0–32)
AST: 11 IU/L (ref 0–40)
Albumin/Globulin Ratio: 1.5 (ref 1.2–2.2)
Albumin: 4.4 g/dL (ref 3.8–4.8)
Alkaline Phosphatase: 72 IU/L (ref 44–121)
BUN/Creatinine Ratio: 11 (ref 9–23)
BUN: 7 mg/dL (ref 6–24)
Bilirubin Total: 0.4 mg/dL (ref 0.0–1.2)
CO2: 26 mmol/L (ref 20–29)
Calcium: 9.7 mg/dL (ref 8.7–10.2)
Chloride: 106 mmol/L (ref 96–106)
Creatinine, Ser: 0.61 mg/dL (ref 0.57–1.00)
Globulin, Total: 3 g/dL (ref 1.5–4.5)
Glucose: 97 mg/dL (ref 70–99)
Potassium: 4 mmol/L (ref 3.5–5.2)
Sodium: 145 mmol/L — ABNORMAL HIGH (ref 134–144)
Total Protein: 7.4 g/dL (ref 6.0–8.5)
eGFR: 111 mL/min/{1.73_m2} (ref >59)

## 2022-05-21 LAB — CBC
Hematocrit: 40.3 % (ref 34.0–46.6)
Hemoglobin: 13.5 g/dL (ref 11.1–15.9)
MCH: 28.2 pg (ref 26.6–33.0)
MCHC: 33.5 g/dL (ref 31.5–35.7)
MCV: 84 fL (ref 79–97)
Platelets: 234 10*3/uL (ref 150–450)
RBC: 4.78 x10E6/uL (ref 3.77–5.28)
RDW: 12.8 % (ref 11.7–15.4)
WBC: 6.8 10*3/uL (ref 3.4–10.8)

## 2022-05-21 LAB — VITAMIN D 25 HYDROXY (VIT D DEFICIENCY, FRACTURES): Vit D, 25-Hydroxy: 21.9 ng/mL — ABNORMAL LOW (ref 30.0–100.0)

## 2022-05-21 LAB — HEMOGLOBIN A1C
Est. average glucose Bld gHb Est-mCnc: 131 mg/dL
Hgb A1c MFr Bld: 6.2 % — ABNORMAL HIGH (ref 4.8–5.6)

## 2022-05-21 MED ORDER — VITAMIN D (ERGOCALCIFEROL) 1.25 MG (50000 UNIT) PO CAPS: 50000.0000 [IU] | ORAL_CAPSULE | ORAL | 1 refills | Status: DC

## 2022-05-22 ENCOUNTER — Other Ambulatory Visit: Payer: Self-pay

## 2022-05-22 DIAGNOSIS — E559 Vitamin D deficiency, unspecified: Secondary | ICD-10-CM

## 2022-05-22 MED ORDER — VITAMIN D (ERGOCALCIFEROL) 1.25 MG (50000 UNIT) PO CAPS: 50000.0000 [IU] | ORAL_CAPSULE | ORAL | 1 refills | Status: DC

## 2022-07-24 ENCOUNTER — Encounter: Payer: Self-pay | Admitting: Nurse Practitioner

## 2022-09-05 ENCOUNTER — Encounter: Payer: Self-pay | Admitting: Nurse Practitioner

## 2023-05-25 ENCOUNTER — Encounter: Payer: Managed Care, Other (non HMO) | Admitting: Nurse Practitioner

## 2023-06-02 ENCOUNTER — Ambulatory Visit (INDEPENDENT_AMBULATORY_CARE_PROVIDER_SITE_OTHER): Payer: Managed Care, Other (non HMO) | Admitting: Nurse Practitioner

## 2023-06-02 ENCOUNTER — Encounter: Payer: Self-pay | Admitting: Nurse Practitioner

## 2023-06-02 ENCOUNTER — Other Ambulatory Visit: Payer: Self-pay | Admitting: Nurse Practitioner

## 2023-06-02 VITALS — BP 110/82 | HR 74 | Temp 98.8°F | Ht 66.0 in | Wt 196.8 lb

## 2023-06-02 DIAGNOSIS — Z8659 Personal history of other mental and behavioral disorders: Secondary | ICD-10-CM

## 2023-06-02 DIAGNOSIS — Z862 Personal history of diseases of the blood and blood-forming organs and certain disorders involving the immune mechanism: Secondary | ICD-10-CM

## 2023-06-02 DIAGNOSIS — Z0000 Encounter for general adult medical examination without abnormal findings: Secondary | ICD-10-CM | POA: Diagnosis not present

## 2023-06-02 DIAGNOSIS — R7309 Other abnormal glucose: Secondary | ICD-10-CM | POA: Diagnosis not present

## 2023-06-02 DIAGNOSIS — M25511 Pain in right shoulder: Secondary | ICD-10-CM | POA: Diagnosis not present

## 2023-06-02 DIAGNOSIS — H43392 Other vitreous opacities, left eye: Secondary | ICD-10-CM | POA: Insufficient documentation

## 2023-06-02 DIAGNOSIS — G43009 Migraine without aura, not intractable, without status migrainosus: Secondary | ICD-10-CM

## 2023-06-02 DIAGNOSIS — D509 Iron deficiency anemia, unspecified: Secondary | ICD-10-CM | POA: Insufficient documentation

## 2023-06-02 DIAGNOSIS — E7800 Pure hypercholesterolemia, unspecified: Secondary | ICD-10-CM

## 2023-06-02 DIAGNOSIS — M25551 Pain in right hip: Secondary | ICD-10-CM

## 2023-06-02 DIAGNOSIS — Z1211 Encounter for screening for malignant neoplasm of colon: Secondary | ICD-10-CM

## 2023-06-02 DIAGNOSIS — E559 Vitamin D deficiency, unspecified: Secondary | ICD-10-CM

## 2023-06-02 MED ORDER — VITAMIN D3 250 MCG (10000 UT) PO CAPS: 10000.0000 [IU] | ORAL_CAPSULE | Freq: Every day | ORAL | 3 refills | Status: AC

## 2023-06-02 MED ORDER — ELETRIPTAN HYDROBROMIDE 20 MG PO TABS
ORAL_TABLET | ORAL | 2 refills | Status: DC
Start: 2023-06-02 — End: 2023-11-29

## 2023-06-02 NOTE — Progress Notes (Signed)
I,Randale Carvalho,acting as a Neurosurgeon for Arnette Felts, FNP.,have documented all relevant documentation on the behalf of Arnette Felts, FNP,as directed by  Arnette Felts, FNP while in the presence of Arnette Felts, FNP.  Subjective:    Patient ID: Brooke Lozano , female    DOB: [birth year] , 47 y.o.   MRN: [id number]  Chief Complaint  Patient presents with   Annual Exam    HPI  Patient presents today for HM, patient reports compliance with medications and has no other concerns today. Patient denies any chest pain, SOB, and headaches.   Patient reports for 2 weeks she has been having hip pain.   Patient reports she has had some depression, but patient refused PHQ-9 and GAD-7.  BP Readings from Last 3 Encounters: 06/02/23 : 110/82 05/20/22 : 110/62 12/10/21 : 118/82    Hip Pain  The incident occurred more than 1 week ago. There was no injury mechanism. The pain is present in the right hip. The quality of the pain is described as shooting and aching. The pain is at a severity of 8/10. The pain is severe. The pain has been Constant since onset. Pertinent negatives include no inability to bear weight, numbness or tingling. The symptoms are aggravated by movement and palpation (sit or walking for long periods). She has tried heat and ice (aleve) for the symptoms. The treatment provided no relief.     Past Medical History:  Diagnosis Date   Depression    Fibroids    Migraines    Prediabetes      Family History  Problem Relation Age of Onset   Alzheimer's disease Mother    Diabetes Father    Hypertension Father    Kidney disease Sister    Hypertension Sister    Heart failure Sister    Diabetes Sister    Kidney disease Sister    Breast cancer Sister    Peripheral Artery Disease Sister    Hyperlipidemia Sister      Current Outpatient Medications:    Cholecalciferol (VITAMIN D3) 250 MCG (10000 UT) capsule, Take 1 capsule (10,000 Units total) by mouth daily., Disp: 30 capsule, Rfl:  3   eletriptan (RELPAX) 20 MG tablet, Take 1 tablet by mouth at onset of headache. May repeat in 2 hours if headache persists or recurs, Disp: 10 tablet, Rfl: 2   Allergies  Allergen Reactions   Other     NAUSEA MEDICINE PRESCRIBED TO PREGNANT WOMEN-CAUSED SLURRED SPEECH   Penicillins Nausea And Vomiting      The patient states she uses none for birth control. No LMP recorded (lmp unknown). (Menstrual status: Other).   Negative for: breast discharge, breast lump(s), breast pain and breast self exam. Associated symptoms include abnormal vaginal bleeding. Pertinent negatives include abnormal bleeding (hematology), anxiety, decreased libido, depression, difficulty falling sleep, dyspareunia, history of infertility, nocturia, sexual dysfunction, sleep disturbances, urinary incontinence, urinary urgency, vaginal discharge and vaginal itching. Diet regular; sometimes will eat a keto diet.  The patient states her exercise level is moderate 5 days a week, 30 minutes to 1 hour.   The patient's tobacco use is:  Social History   Tobacco Use  Smoking Status Never  Smokeless Tobacco Never   She has been exposed to passive smoke. The patient's alcohol use is:  Social History   Substance and Sexual Activity  Alcohol Use No   Additional information: Last pap 08/30/2020, next one scheduled for 08/31/2023.    Review of Systems  Constitutional: Negative.  HENT: Negative.    Eyes: Negative.   Respiratory: Negative.    Cardiovascular: Negative.   Gastrointestinal: Negative.   Endocrine: Negative.   Genitourinary: Negative.   Musculoskeletal:  Positive for arthralgias (right hip pain).  Skin: Negative.   Allergic/Immunologic: Negative.   Neurological:  Positive for headaches. Negative for tingling and numbness.  Hematological: Negative.   Psychiatric/Behavioral: Negative.       Today's Vitals   06/02/23 1111  BP: 110/82  Pulse: 74  Temp: 98.8 F (37.1 C)  TempSrc: Oral  Weight: 196 lb  12.8 oz (89.3 kg)  Height: 5\' 6"  (1.676 m)  PainSc: 8   PainLoc: Hip   Body mass index is 31.76 kg/m.  Wt Readings from Last 3 Encounters:  06/02/23 196 lb 12.8 oz (89.3 kg)  05/20/22 193 lb (87.5 kg)  12/10/21 187 lb 6.4 oz (85 kg)     Objective:  Physical Exam Vitals reviewed.  Constitutional:      General: She is not in acute distress.    Appearance: Normal appearance. She is well-developed.  HENT:     Head: Normocephalic and atraumatic.     Right Ear: Hearing, tympanic membrane, ear canal and external ear normal. There is no impacted cerumen.     Left Ear: Hearing, tympanic membrane, ear canal and external ear normal. There is no impacted cerumen.     Nose: Nose normal.     Mouth/Throat:     Mouth: Mucous membranes are moist.  Eyes:     General: Lids are normal.     Extraocular Movements: Extraocular movements intact.     Conjunctiva/sclera: Conjunctivae normal.     Pupils: Pupils are equal, round, and reactive to light.     Funduscopic exam:    Right eye: No papilledema.        Left eye: No papilledema.  Neck:     Thyroid: No thyroid mass.     Vascular: No carotid bruit.  Cardiovascular:     Rate and Rhythm: Normal rate and regular rhythm.     Pulses: Normal pulses.     Heart sounds: Normal heart sounds. No murmur heard. Pulmonary:     Effort: Pulmonary effort is normal. No respiratory distress.     Breath sounds: Normal breath sounds. No wheezing.  Abdominal:     General: Abdomen is flat. Bowel sounds are normal. There is no distension.     Palpations: Abdomen is soft.     Tenderness: There is no abdominal tenderness.  Genitourinary:    Comments: Deferred followed by GYN Musculoskeletal:        General: No swelling or tenderness. Normal range of motion.     Cervical back: Full passive range of motion without pain, normal range of motion and neck supple.     Right hip: Bony tenderness present. No deformity. Normal range of motion. Normal strength.     Left  hip: Normal. No deformity or bony tenderness. Normal range of motion. Normal strength.     Right lower leg: No edema.     Left lower leg: No edema.  Skin:    General: Skin is warm and dry.     Capillary Refill: Capillary refill takes less than 2 seconds.  Neurological:     General: No focal deficit present.     Mental Status: She is alert and oriented to person, place, and time.     Cranial Nerves: No cranial nerve deficit.     Sensory: No sensory deficit.  Motor: No weakness.  Psychiatric:        Mood and Affect: Mood normal.        Behavior: Behavior normal.        Thought Content: Thought content normal.        Judgment: Judgment normal.         Assessment And Plan:     Encounter for annual health examination Assessment & Plan: Behavior modifications discussed and diet history reviewed.   Pt will continue to exercise regularly and modify diet with low GI, plant based foods and decrease intake of processed foods.  Recommend intake of daily multivitamin, Vitamin D, and calcium.  Recommend mammogram and colonoscopy for preventive screenings, as well as recommend immunizations that include influenza, TDAP, and Shingles    Encounter for screening colonoscopy -     Ambulatory referral to Gastroenterology  History of iron deficiency anemia Assessment & Plan: Most recent iron levels are normal.   Orders: -     CBC with Differential/Platelet -     Iron, TIBC and Ferritin Panel  Vitamin D deficiency Assessment & Plan: Will check vitamin D level and supplement as needed.    Also encouraged to spend 15 minutes in the sun daily.    Orders: -     VITAMIN D 25 Hydroxy (Vit-D Deficiency, Fractures)  Abnormal glucose Assessment & Plan: HgbA1c is stable. Continue focusing on healthy diet.   Orders: -     Hemoglobin A1c  Migraine without aura and without status migrainosus, not intractable Assessment & Plan: Infrequent however continue Relpax as needed  Orders: -      CMP14+EGFR -     Eletriptan Hydrobromide; Take 1 tablet by mouth at onset of headache. May repeat in 2 hours if headache persists or recurs  Dispense: 10 tablet; Refill: 2  Elevated LDL cholesterol level Assessment & Plan: Cholesterol levels are stable, continue focusing on low fat diet. A low fat, low cholesterol is discussed with the patient   Orders: -     Lipid panel  History of depression Assessment & Plan: unable to classify at this time due to refusing to do PHQ, has flat affect. Requested referral to counselor, placed another referral to [facility] and given phone number.   Orders: -     Ambulatory referral to Psychology  Right hip pain Assessment & Plan: No pain illicited on physical exam or decreased ROM, will check xray and given samples of solanpas patches. Bursitis vs arthritis vs injury (least likely).   Orders: -     DG HIP UNILAT W OR W/O PELVIS 2-3 VIEWS RIGHT; Future  Vitreous floaters of left eye Assessment & Plan: No abnormal findings on physical exam, vision 20/20 will refer to opthalmology  Orders: -     Ambulatory referral to Ophthalmology  Acute pain of right shoulder -     DG Shoulder Right; Future  Other orders -     Vitamin D3; Take 1 capsule (10,000 Units total) by mouth daily.  Dispense: 30 capsule; Refill: 3   Return for 1 year physical.  Patient was given opportunity to ask questions. Patient verbalized understanding of the plan and was able to repeat key elements of the plan. All questions were answered to their satisfaction.   Arnette Felts, FNP  I, Arnette Felts, FNP, have reviewed all documentation for this visit. The documentation on 06/02/23 for the exam, diagnosis, procedures, and orders are all accurate and complete.

## 2023-06-02 NOTE — Assessment & Plan Note (Signed)
No abnormal findings on physical exam, vision 20/20 will refer to opthalmology

## 2023-06-02 NOTE — Assessment & Plan Note (Signed)

## 2023-06-02 NOTE — Assessment & Plan Note (Signed)
HgbA1c is stable. Continue focusing on healthy diet.

## 2023-06-02 NOTE — Assessment & Plan Note (Signed)
Will check vitamin D level and supplement as needed.    Also encouraged to spend 15 minutes in the sun daily.   

## 2023-06-02 NOTE — Assessment & Plan Note (Signed)
No pain illicited on physical exam or decreased ROM, will check xray and given samples of solanpas patches. Bursitis vs arthritis vs injury (least likely).

## 2023-06-02 NOTE — Assessment & Plan Note (Signed)
Cholesterol levels are stable, continue focusing on low fat diet. A low fat, low cholesterol is discussed with the patient

## 2023-06-02 NOTE — Assessment & Plan Note (Signed)
unable to classify at this time due to refusing to do PHQ, has flat affect. Requested referral to counselor, placed another referral to [facility] and given phone number.

## 2023-06-02 NOTE — Assessment & Plan Note (Signed)
Infrequent however continue Relpax as needed

## 2023-06-02 NOTE — Assessment & Plan Note (Signed)
Most recent iron levels are normal.

## 2023-06-02 NOTE — Patient Instructions (Addendum)
[facility] [phone]  [facility]       Address: [address], [place], [place] [address]        Phone: [phone] Go to [address] for your xray the order has already been placed.  This is the number for behavioral health you can Call our 24-hour HelpLine at [phone] or [phone]

## 2023-06-03 LAB — CBC WITH DIFFERENTIAL/PLATELET
Basophils Absolute: 0 10*3/uL (ref 0.0–0.2)
Basos: 1 %
EOS (ABSOLUTE): 0.1 10*3/uL (ref 0.0–0.4)
Eos: 2 %
Hematocrit: 39.5 % (ref 34.0–46.6)
Hemoglobin: 13.2 g/dL (ref 11.1–15.9)
Immature Grans (Abs): 0 10*3/uL (ref 0.0–0.1)
Immature Granulocytes: 0 %
Lymphocytes Absolute: 2.4 10*3/uL (ref 0.7–3.1)
Lymphs: 41 %
MCH: 28.1 pg (ref 26.6–33.0)
MCHC: 33.4 g/dL (ref 31.5–35.7)
MCV: 84 fL (ref 79–97)
Monocytes Absolute: 0.4 10*3/uL (ref 0.1–0.9)
Monocytes: 7 %
Neutrophils Absolute: 2.9 10*3/uL (ref 1.4–7.0)
Neutrophils: 49 %
Platelets: 233 10*3/uL (ref 150–450)
RBC: 4.7 x10E6/uL (ref 3.77–5.28)
RDW: 12.5 % (ref 11.7–15.4)
WBC: 5.9 10*3/uL (ref 3.4–10.8)

## 2023-06-03 LAB — CMP14+EGFR
ALT: 15 IU/L (ref 0–32)
AST: 12 IU/L (ref 0–40)
Albumin: 4.7 g/dL (ref 3.9–4.9)
Alkaline Phosphatase: 74 IU/L (ref 44–121)
BUN/Creatinine Ratio: 18 (ref 9–23)
BUN: 11 mg/dL (ref 6–24)
Bilirubin Total: 0.3 mg/dL (ref 0.0–1.2)
CO2: 24 mmol/L (ref 20–29)
Calcium: 9.6 mg/dL (ref 8.7–10.2)
Chloride: 100 mmol/L (ref 96–106)
Creatinine, Ser: 0.61 mg/dL (ref 0.57–1.00)
Globulin, Total: 3.1 g/dL (ref 1.5–4.5)
Glucose: 89 mg/dL (ref 70–99)
Potassium: 3.9 mmol/L (ref 3.5–5.2)
Sodium: 138 mmol/L (ref 134–144)
Total Protein: 7.8 g/dL (ref 6.0–8.5)
eGFR: 110 mL/min/{1.73_m2} (ref >59)

## 2023-06-03 LAB — LIPID PANEL
Chol/HDL Ratio: 3.6 ratio (ref 0.0–4.4)
Cholesterol, Total: 190 mg/dL (ref 100–199)
HDL: 53 mg/dL (ref >39)
LDL Chol Calc (NIH): 125 mg/dL — ABNORMAL HIGH (ref 0–99)
Triglycerides: 62 mg/dL (ref 0–149)
VLDL Cholesterol Cal: 12 mg/dL (ref 5–40)

## 2023-06-03 LAB — IRON,TIBC AND FERRITIN PANEL
Ferritin: 54 ng/mL (ref 15–150)
Iron Saturation: 20 % (ref 15–55)
Iron: 68 ug/dL (ref 27–159)
Total Iron Binding Capacity: 334 ug/dL (ref 250–450)
UIBC: 266 ug/dL (ref 131–425)

## 2023-06-03 LAB — HEMOGLOBIN A1C
Est. average glucose Bld gHb Est-mCnc: 131 mg/dL
Hgb A1c MFr Bld: 6.2 % — ABNORMAL HIGH (ref 4.8–5.6)

## 2023-06-03 LAB — VITAMIN D 25 HYDROXY (VIT D DEFICIENCY, FRACTURES): Vit D, 25-Hydroxy: 36.7 ng/mL (ref 30.0–100.0)

## 2023-06-04 ENCOUNTER — Ambulatory Visit
Admission: RE | Admit: 2023-06-04 | Discharge: 2023-06-04 | Disposition: A | Discharge status: Home / Self Care | Payer: Managed Care, Other (non HMO) | Source: Ambulatory Visit | Attending: Nurse Practitioner | Admitting: Nurse Practitioner

## 2023-06-04 DIAGNOSIS — M25551 Pain in right hip: Secondary | ICD-10-CM

## 2023-06-04 DIAGNOSIS — M25511 Pain in right shoulder: Secondary | ICD-10-CM

## 2023-06-24 LAB — CBC WITH DIFFERENTIAL/PLATELET
Basophils Absolute: 0.1 10*3/uL (ref 0.0–0.2)
Basos: 1 %
EOS (ABSOLUTE): 0.2 10*3/uL (ref 0.0–0.4)
Eos: 3 %
Hematocrit: 40.9 % (ref 34.0–46.6)
Hemoglobin: 13.9 g/dL (ref 11.1–15.9)
Immature Grans (Abs): 0 10*3/uL (ref 0.0–0.1)
Immature Granulocytes: 0 %
Lymphocytes Absolute: 2.4 10*3/uL (ref 0.7–3.1)
Lymphs: 40 %
MCH: 29.1 pg (ref 26.6–33.0)
MCHC: 34 g/dL (ref 31.5–35.7)
MCV: 86 fL (ref 79–97)
Monocytes Absolute: 0.5 10*3/uL (ref 0.1–0.9)
Monocytes: 8 %
Neutrophils Absolute: 2.9 10*3/uL (ref 1.4–7.0)
Neutrophils: 48 %
Platelets: 239 10*3/uL (ref 150–450)
RBC: 4.77 x10E6/uL (ref 3.77–5.28)
RDW: 12.9 % (ref 11.7–15.4)
WBC: 6 10*3/uL (ref 3.4–10.8)

## 2023-06-24 LAB — CMP14+EGFR
ALT: 17 IU/L (ref 0–32)
AST: 16 IU/L (ref 0–40)
Albumin: 4.4 g/dL (ref 3.9–4.9)
Alkaline Phosphatase: 77 IU/L (ref 44–121)
BUN/Creatinine Ratio: 18 (ref 9–23)
BUN: 10 mg/dL (ref 6–24)
Bilirubin Total: 0.3 mg/dL (ref 0.0–1.2)
CO2: 26 mmol/L (ref 20–29)
Calcium: 9.9 mg/dL (ref 8.7–10.2)
Chloride: 101 mmol/L (ref 96–106)
Creatinine, Ser: 0.57 mg/dL (ref 0.57–1.00)
Globulin, Total: 3.2 g/dL (ref 1.5–4.5)
Glucose: 87 mg/dL (ref 70–99)
Potassium: 3.9 mmol/L (ref 3.5–5.2)
Sodium: 141 mmol/L (ref 134–144)
Total Protein: 7.6 g/dL (ref 6.0–8.5)
eGFR: 112 mL/min/{1.73_m2} (ref >59)

## 2023-06-24 LAB — LIPID PANEL
Chol/HDL Ratio: 3.5 ratio (ref 0.0–4.4)
Cholesterol, Total: 169 mg/dL (ref 100–199)
HDL: 48 mg/dL (ref >39)
LDL Chol Calc (NIH): 94 mg/dL (ref 0–99)
Triglycerides: 152 mg/dL — ABNORMAL HIGH (ref 0–149)
VLDL Cholesterol Cal: 27 mg/dL (ref 5–40)

## 2023-06-24 LAB — IRON,TIBC AND FERRITIN PANEL
Ferritin: 52 ng/mL (ref 15–150)
Iron Saturation: 20 % (ref 15–55)
Iron: 66 ug/dL (ref 27–159)
Total Iron Binding Capacity: 322 ug/dL (ref 250–450)
UIBC: 256 ug/dL (ref 131–425)

## 2023-06-24 LAB — HEMOGLOBIN A1C
Est. average glucose Bld gHb Est-mCnc: 131 mg/dL
Hgb A1c MFr Bld: 6.2 % — ABNORMAL HIGH (ref 4.8–5.6)

## 2023-06-24 LAB — VITAMIN D 25 HYDROXY (VIT D DEFICIENCY, FRACTURES): Vit D, 25-Hydroxy: 38.9 ng/mL (ref 30.0–100.0)

## 2023-11-29 ENCOUNTER — Encounter: Payer: Self-pay | Admitting: Nurse Practitioner

## 2023-11-29 ENCOUNTER — Ambulatory Visit: Payer: Managed Care, Other (non HMO) | Admitting: Nurse Practitioner

## 2023-11-29 VITALS — BP 110/80 | HR 61 | Temp 98.4°F | Ht 66.0 in | Wt 197.6 lb

## 2023-11-29 DIAGNOSIS — S29011A Strain of muscle and tendon of front wall of thorax, initial encounter: Secondary | ICD-10-CM | POA: Diagnosis not present

## 2023-11-29 DIAGNOSIS — Z01419 Encounter for gynecological examination (general) (routine) without abnormal findings: Secondary | ICD-10-CM

## 2023-11-29 DIAGNOSIS — M62838 Other muscle spasm: Secondary | ICD-10-CM | POA: Insufficient documentation

## 2023-11-29 DIAGNOSIS — E6609 Other obesity due to excess calories: Secondary | ICD-10-CM

## 2023-11-29 DIAGNOSIS — Z1211 Encounter for screening for malignant neoplasm of colon: Secondary | ICD-10-CM

## 2023-11-29 DIAGNOSIS — E66811 Obesity, class 1: Secondary | ICD-10-CM | POA: Insufficient documentation

## 2023-11-29 DIAGNOSIS — G43009 Migraine without aura, not intractable, without status migrainosus: Secondary | ICD-10-CM | POA: Diagnosis not present

## 2023-11-29 DIAGNOSIS — Z2821 Immunization not carried out because of patient refusal: Secondary | ICD-10-CM | POA: Insufficient documentation

## 2023-11-29 DIAGNOSIS — Z1231 Encounter for screening mammogram for malignant neoplasm of breast: Secondary | ICD-10-CM

## 2023-11-29 DIAGNOSIS — Z6831 Body mass index (BMI) 31.0-31.9, adult: Secondary | ICD-10-CM

## 2023-11-29 MED ORDER — ELETRIPTAN HYDROBROMIDE 20 MG PO TABS: ORAL_TABLET | ORAL | 2 refills | Status: DC

## 2023-11-29 MED ORDER — TIZANIDINE HCL 4 MG PO TABS: 4.0000 mg | ORAL_TABLET | Freq: Every day | ORAL | 1 refills | Status: DC

## 2023-11-29 NOTE — Progress Notes (Unsigned)
Madelaine Bhat, CMA,acting as a Neurosurgeon for Arnette Felts, FNP.,have documented all relevant documentation on the behalf of Arnette Felts, FNP,as directed by  Arnette Felts, FNP while in the presence of Arnette Felts, FNP.  Subjective:  Patient ID: Brooke Lozano , female    DOB: 1976-06-01 , 47 y.o.   MRN: [id number]  Chief Complaint  Patient presents with   Spasms    HPI  Patient presents today for muscle spasms in her lower back, patient reports it first started yesterday morning. Patient is unsure of what made it hurt, she reports she thinks she pulled a muscle in her shoulder recently. She has been applying ice and heat without relief. She had one muscle relaxer left and took last night. Not much relief. She is having pain with waking and standing up straight. No numbness and tingling.       Past Medical History:  Diagnosis Date   Depression    Fibroids    Migraines    Prediabetes      Family History  Problem Relation Age of Onset   Alzheimer's disease Mother    Diabetes Father    Hypertension Father    Kidney disease Sister    Hypertension Sister    Heart failure Sister    Diabetes Sister    Kidney disease Sister    Breast cancer Sister    Peripheral Artery Disease Sister    Hyperlipidemia Sister      Current Outpatient Medications:    Cholecalciferol (VITAMIN D3) 250 MCG (10000 UT) capsule, Take 1 capsule (10,000 Units total) by mouth daily., Disp: 30 capsule, Rfl: 3   tiZANidine (ZANAFLEX) 4 MG tablet, Take 1 tablet (4 mg total) by mouth daily., Disp: 30 tablet, Rfl: 1   eletriptan (RELPAX) 20 MG tablet, Take 1 tablet by mouth at onset of headache. May repeat in 2 hours if headache persists or recurs, Disp: 10 tablet, Rfl: 2   Allergies  Allergen Reactions   Other     NAUSEA MEDICINE PRESCRIBED TO PREGNANT WOMEN-CAUSED SLURRED SPEECH   Penicillins Nausea And Vomiting     Review of Systems  Constitutional: Negative.   HENT: Negative.    Eyes: Negative.    Respiratory: Negative.    Cardiovascular: Negative.   Gastrointestinal: Negative.   Psychiatric/Behavioral: Negative.       Today's Vitals   11/29/23 1053  BP: 110/80  Pulse: 61  Temp: 98.4 F (36.9 C)  TempSrc: Oral  Weight: 197 lb 9.6 oz (89.6 kg)  Height: 5\' 6"  (1.676 m)  PainSc: 8   PainLoc: Back   Body mass index is 31.89 kg/m.  Wt Readings from Last 3 Encounters:  11/29/23 197 lb 9.6 oz (89.6 kg)  06/02/23 196 lb 12.8 oz (89.3 kg)  05/20/22 193 lb (87.5 kg)      Objective:  Physical Exam Vitals reviewed.  Constitutional:      General: She is not in acute distress.    Appearance: Normal appearance.  Cardiovascular:     Rate and Rhythm: Normal rate and regular rhythm.     Pulses: Normal pulses.     Heart sounds: Normal heart sounds. No murmur heard. Pulmonary:     Effort: Pulmonary effort is normal. No respiratory distress.     Breath sounds: Normal breath sounds. No wheezing.  Skin:    General: Skin is warm and dry.     Capillary Refill: Capillary refill takes less than 2 seconds.  Neurological:     General: No  focal deficit present.     Mental Status: She is alert and oriented to person, place, and time.     Cranial Nerves: No cranial nerve deficit.     Motor: No weakness.     Assessment And Plan:  Muscle spasm Assessment & Plan: Encouraged to stretch regularly and take muscle relaxer.    Migraine without aura and without status migrainosus, not intractable Assessment & Plan: Infrequent however continue Relpax as needed, she is advised to avoid taking Aleve/Ibuprofen due to risk for rebound headaches  Orders: -     Eletriptan Hydrobromide; Take 1 tablet by mouth at onset of headache. May repeat in 2 hours if headache persists or recurs  Dispense: 10 tablet; Refill: 2  Muscle strain of chest wall, initial encounter Assessment & Plan: Mild tenderness on palpation to left chest wall, use heating pad and tylenol as needed for pain. Muscle relaxer  may be effective as well.    COVID-19 vaccination declined Assessment & Plan: Declines covid 19 vaccine. Discussed risk of covid 42 and if she changes her mind about the vaccine to call the office. Education has been provided regarding the importance of this vaccine but patient still declined. Advised may receive this vaccine at local pharmacy or Health Dept.or vaccine clinic. Aware to provide a copy of the vaccination record if obtained from local pharmacy or Health Dept.  Encouraged to take multivitamin, vitamin d, vitamin c and zinc to increase immune system. Aware can call office if would like to have vaccine here at office. Verbalized acceptance and understanding.    Influenza vaccination declined Assessment & Plan: Patient declined influenza vaccination at this time. Patient is aware that influenza vaccine prevents illness in 70% of healthy people, and reduces hospitalizations to 30-70% in elderly. This vaccine is recommended annually. Education has been provided regarding the importance of this vaccine but patient still declined. Advised may receive this vaccine at local pharmacy or Health Dept.or vaccine clinic. Aware to provide a copy of the vaccination record if obtained from local pharmacy or Health Dept.  Pt is willing to accept risk associated with refusing vaccination.    Class 1 obesity due to excess calories without serious comorbidity with body mass index (BMI) of 31.0 to 31.9 in adult Assessment & Plan: She is encouraged to strive for BMI less than 30 to decrease cardiac risk. Advised to aim for at least 150 minutes of exercise per week.    Encounter for screening mammogram for breast cancer Assessment & Plan: Pt instructed on Self Breast Exam.According to ACOG guidelines Women aged 75 and older are recommended to get an annual mammogram. Order placed   Orders: -     3D Screening Mammogram, Left and Right; Future  Encounter for gynecological examination -      Ambulatory referral to Gynecology  Colon cancer screening Assessment & Plan: According to USPTF Colorectal cancer Screening guidelines. Colonoscopy is recommended every 10 years, starting at age 38 years. Will refer to GI for colon cancer screening.   Orders: -     Cologuard  Other orders -     tiZANidine HCl; Take 1 tablet (4 mg total) by mouth daily.  Dispense: 30 tablet; Refill: 1    Return for has appt in June 2025.  Patient was given opportunity to ask questions. Patient verbalized understanding of the plan and was able to repeat key elements of the plan. All questions were answered to their satisfaction.   Jeanell Sparrow, FNP, have reviewed all  documentation for this visit. The documentation on 11/29/23 for the exam, diagnosis, procedures, and orders are all accurate and complete.   IF YOU HAVE BEEN REFERRED TO A SPECIALIST, IT MAY TAKE 1-2 WEEKS TO SCHEDULE/PROCESS THE REFERRAL. IF YOU HAVE NOT HEARD FROM US/SPECIALIST IN TWO WEEKS, PLEASE GIVE [place] A CALL AT [phone].

## 2023-12-02 NOTE — Assessment & Plan Note (Signed)

## 2023-12-02 NOTE — Assessment & Plan Note (Signed)
Infrequent however continue Relpax as needed, she is advised to avoid taking Aleve/Ibuprofen due to risk for rebound headaches

## 2023-12-02 NOTE — Assessment & Plan Note (Signed)
Encouraged to stretch regularly and take muscle relaxer.

## 2023-12-02 NOTE — Assessment & Plan Note (Signed)
 According to USPTF Colorectal cancer Screening guidelines. Colonoscopy is recommended every 10 years, starting at age 48 years. Will refer to GI for colon cancer screening.

## 2023-12-02 NOTE — Assessment & Plan Note (Signed)
Pt instructed on Self Breast Exam.According to ACOG guidelines Women aged 48 and older are recommended to get an annual mammogram. Order placed

## 2023-12-02 NOTE — Assessment & Plan Note (Signed)

## 2023-12-02 NOTE — Assessment & Plan Note (Signed)
Mild tenderness on palpation to left chest wall, use heating pad and tylenol as needed for pain. Muscle relaxer may be effective as well.

## 2023-12-02 NOTE — Assessment & Plan Note (Signed)
 She is encouraged to strive for BMI less than 30 to decrease cardiac risk. Advised to aim for at least 150 minutes of exercise per week.

## 2023-12-07 ENCOUNTER — Other Ambulatory Visit: Payer: Self-pay

## 2023-12-07 ENCOUNTER — Encounter (HOSPITAL_BASED_OUTPATIENT_CLINIC_OR_DEPARTMENT_OTHER): Payer: Self-pay

## 2023-12-07 ENCOUNTER — Emergency Department (HOSPITAL_BASED_OUTPATIENT_CLINIC_OR_DEPARTMENT_OTHER)
Admission: EM | Admit: 2023-12-07 | Discharge: 2023-12-07 | Disposition: A | Discharge status: Home / Self Care | Payer: Managed Care, Other (non HMO) | Attending: Emergency Medicine | Admitting: Emergency Medicine

## 2023-12-07 DIAGNOSIS — R059 Cough, unspecified: Secondary | ICD-10-CM | POA: Diagnosis present

## 2023-12-07 DIAGNOSIS — Z1152 Encounter for screening for COVID-19: Secondary | ICD-10-CM | POA: Insufficient documentation

## 2023-12-07 DIAGNOSIS — J101 Influenza due to other identified influenza virus with other respiratory manifestations: Secondary | ICD-10-CM | POA: Diagnosis not present

## 2023-12-07 LAB — RESP PANEL BY RT-PCR (RSV, FLU A&B, COVID)  RVPGX2
Influenza A by PCR: POSITIVE — AB
Influenza B by PCR: NEGATIVE
Resp Syncytial Virus by PCR: NEGATIVE
SARS Coronavirus 2 by RT PCR: NEGATIVE

## 2023-12-07 MED ORDER — BENZONATATE 100 MG PO CAPS: 100.0000 mg | ORAL_CAPSULE | Freq: Three times a day (TID) | ORAL | 0 refills | Status: DC

## 2023-12-07 MED ORDER — BENZONATATE 100 MG PO CAPS
100.0000 mg | ORAL_CAPSULE | Freq: Three times a day (TID) | ORAL | 0 refills | Status: DC
  Filled 2023-12-07: qty 21, 7d supply, fill #0

## 2023-12-07 NOTE — ED Triage Notes (Signed)
Patient arrives ambulatory to ED with complaints of Flu-like symptoms.  Reports a dry cough, headaches, and fatigue x4 days.

## 2023-12-07 NOTE — Discharge Instructions (Addendum)
As we discussed, you tested positive for the flu today.  As this is a viral infection, no antibiotics are indicated.  I recommend that you get plenty of rest and focus on symptomatic relief which includes Cepacol throat lozenges for sore throat, Mucinex DM (green box) which you can get from behind the counter at your local pharmacy for congestion, and tylenol/ibuprofen as needed for fevers and bodyaches. I have also given you a prescription for Tessalon which is a cough suppressant medication for you to take as prescribed as needed for management of your symptoms. I also recommend:  Increased fluid intake. Sports drinks offer valuable electrolytes, sugars, and fluids.  Breathing heated mist or steam (vaporizer or shower).  Eating chicken soup or other clear broths, and maintaining good nutrition.   Increasing usage of your inhaler if you have asthma.  Return to work when your temperature has returned to normal.  Gargle warm salt water and spit it out for sore throat. Take benadryl or Zyrtec to decrease sinus secretions.  Follow Up: Follow up with your primary care doctor in 5-7 days for recheck of ongoing symptoms.  Return to emergency department for emergent changing or worsening of symptoms.

## 2023-12-07 NOTE — ED Provider Notes (Signed)
[place] EMERGENCY DEPARTMENT AT [facility] Provider Note   CSN: [id number] Arrival date & time: 12/07/23  1356     History  Chief Complaint  Patient presents with   Cough   Fatigue    Brooke Lozano is a 47 y.o. female.  Patient with no pertinent past medical history presents today with complaints of cough. She states that same began 4 days ago and has been persistent since then. Denies any known sick contacts. She has been trying theraflu with some improvement. States she had fevers on the first day but none since then. She states that her cough is dry and non-productive. Denies chest pain or shortness of breath.  The history is provided by the patient. No language interpreter was used.  Cough      Home Medications Prior to Admission medications   Medication Sig Start Date End Date Taking? Authorizing Provider  Cholecalciferol (VITAMIN D3) 250 MCG (10000 UT) capsule Take 1 capsule (10,000 Units total) by mouth daily. 06/02/23   Arnette Felts, FNP  eletriptan (RELPAX) 20 MG tablet Take 1 tablet by mouth at onset of headache. May repeat in 2 hours if headache persists or recurs 11/29/23   Arnette Felts, FNP  tiZANidine (ZANAFLEX) 4 MG tablet Take 1 tablet (4 mg total) by mouth daily. 11/29/23 11/28/24  Arnette Felts, FNP      Allergies    Other and Penicillins    Review of Systems   Review of Systems  Respiratory:  Positive for cough.   All other systems reviewed and are negative.   Physical Exam Updated Vital Signs BP (!) 132/90   Pulse 75   Temp 98.6 F (37 C) (Oral)   Resp 20   Ht 5' 7.8" (1.722 m)   Wt 89.4 kg   SpO2 100%   BMI 30.13 kg/m  Physical Exam Vitals and nursing note reviewed.  Constitutional:      General: She is not in acute distress.    Appearance: Normal appearance. She is normal weight. She is not ill-appearing, toxic-appearing or diaphoretic.  HENT:     Head: Normocephalic and atraumatic.  Cardiovascular:     Rate and  Rhythm: Normal rate and regular rhythm.     Heart sounds: Normal heart sounds.  Pulmonary:     Effort: Pulmonary effort is normal. No respiratory distress.     Breath sounds: Normal breath sounds.  Musculoskeletal:        General: Normal range of motion.     Cervical back: Normal range of motion and neck supple.  Skin:    General: Skin is warm and dry.  Neurological:     General: No focal deficit present.     Mental Status: She is alert.  Psychiatric:        Mood and Affect: Mood normal.        Behavior: Behavior normal.     ED Results / Procedures / Treatments   Labs (all labs ordered are listed, but only abnormal results are displayed) Labs Reviewed  RESP PANEL BY RT-PCR (RSV, FLU A&B, COVID)  RVPGX2 - Abnormal; Notable for the following components:      Result Value   Influenza A by PCR POSITIVE (*)    All other components within normal limits    EKG None  Radiology No results found.  Procedures Procedures    Medications Ordered in ED Medications - No data to display  ED Course/ Medical Decision Making/ A&P  Medical Decision Making  Patient presents today with complaints of cough and congestion x 4 days.  They are afebrile, nontoxic-appearing, and in no acute distress with reassuring vital signs.  Physical exam reveals lung sounds clear to auscultation in all fields. No indication for CXR imaging at this time. Patient positive for flu A, symptoms consistent with same. Discussed that given duration of symptoms, no indication for Tamiflu at this time.  Discussed with patient that there is no indication for antibiotics for viral infections. Will send for tessalon for cough and recommend close outpatient follow-up with return precautions. OTC recommendations discussed as well.  Evaluation and diagnostic testing in the emergency department does not suggest an emergent condition requiring admission or immediate intervention beyond what has  been performed at this time.  Plan for discharge with close PCP follow-up.  Patient is understanding and amenable with plan, educated on red flag symptoms that would prompt immediate return.  Patient discharged in stable condition.  Final Clinical Impression(s) / ED Diagnoses Final diagnoses:  Influenza A    Rx / DC Orders ED Discharge Orders          Ordered    benzonatate (TESSALON) 100 MG capsule  Every 8 hours,   Status:  Discontinued        12/07/23 1641    benzonatate (TESSALON) 100 MG capsule  Every 8 hours        12/07/23 1642          An After Visit Summary was printed and given to the patient.     Vear Clock 12/07/23 1643    Ernie Avena, MD 12/08/23 1248

## 2023-12-09 ENCOUNTER — Other Ambulatory Visit (HOSPITAL_BASED_OUTPATIENT_CLINIC_OR_DEPARTMENT_OTHER): Payer: Self-pay

## 2023-12-13 ENCOUNTER — Telehealth: Payer: Self-pay

## 2023-12-13 NOTE — Transitions of Care (Post Inpatient/ED Visit) (Signed)
   12/13/2023  Name: Brooke Lozano MRN: [id number] DOB: 21-Jan-1976  Today's TOC FU Call Status: Today's TOC FU Call Status:: Successful TOC FU Call Completed TOC FU Call Complete Date: 12/13/23 Patient's Name and Date of Birth confirmed.  Transition Care Management Follow-up Telephone Call Date of Discharge: 12/07/23 Discharge Facility: [facility] ([facility]) Type of Discharge: Inpatient Admission Primary Inpatient Discharge Diagnosis:: flu How have you been since you were released from the hospital?: Better Any questions or concerns?: No  Items Reviewed: Did you receive and understand the discharge instructions provided?: Yes Medications obtained,verified, and reconciled?: Yes (Medications Reviewed) Any new allergies since your discharge?: Yes Dietary orders reviewed?: No Do you have support at home?: Yes  Medications Reviewed Today: Medications Reviewed Today   Medications were not reviewed in this encounter     Home Care and Equipment/Supplies: Were Home Health Services Ordered?: No Any new equipment or medical supplies ordered?: No  Functional Questionnaire: Do you need assistance with bathing/showering or dressing?: No Do you need assistance with meal preparation?: No Do you need assistance with eating?: No Do you have difficulty maintaining continence: No Do you need assistance with getting out of bed/getting out of a chair/moving?: No Do you have difficulty managing or taking your medications?: No  Follow up appointments reviewed: PCP Follow-up appointment confirmed?: No MD Provider Line Number:[phone] Given: Yes Specialist Hospital Follow-up appointment confirmed?: No Do you need transportation to your follow-up appointment?: No Do you understand care options if your condition(s) worsen?: Yes-patient verbalized understanding    SIGNATURE Lisabeth Devoid, CMA

## 2024-03-13 ENCOUNTER — Encounter: Payer: Self-pay | Admitting: Nurse Practitioner

## 2024-03-13 ENCOUNTER — Ambulatory Visit: Admitting: Nurse Practitioner

## 2024-03-13 VITALS — BP 120/84 | HR 72 | Temp 98.6°F | Ht 67.8 in | Wt 204.0 lb

## 2024-03-13 DIAGNOSIS — M545 Low back pain, unspecified: Secondary | ICD-10-CM | POA: Diagnosis not present

## 2024-03-13 MED ORDER — TIZANIDINE HCL 4 MG PO TABS: 4.0000 mg | ORAL_TABLET | Freq: Every day | ORAL | 1 refills | Status: DC

## 2024-03-13 MED ORDER — KETOROLAC TROMETHAMINE 60 MG/2ML IM SOLN
60.0000 mg | Freq: Once | INTRAMUSCULAR | Status: AC
  Administered 2024-03-13: 60 mg via INTRAMUSCULAR

## 2024-03-13 MED ORDER — TIZANIDINE HCL 4 MG PO TABS: 4.0000 mg | ORAL_TABLET | Freq: Three times a day (TID) | ORAL | 1 refills | Status: DC | PRN

## 2024-03-13 NOTE — Patient Instructions (Signed)
 Call them for an appt - [phone] [facility] [place]

## 2024-03-13 NOTE — Progress Notes (Signed)
 Madelaine Bhat, CMA,acting as a Neurosurgeon for Arnette Felts, FNP.,have documented all relevant documentation on the behalf of Arnette Felts, FNP,as directed by  Arnette Felts, FNP while in the presence of Arnette Felts, FNP.  Subjective:  Patient ID: Brooke Lozano , female    DOB: 15-Nov-1976 , 48 y.o.   MRN: [id number]  Chief Complaint  Patient presents with   Back Pain    HPI  Patient presents today for back pain. She reports she was bending over trying to put down a towel in her backseat and she bent down the wrong way. She reports the pain is in her lower back and she describes it as achy. She took a muscle relaxer earlier but is out, not much relief at this time. She did not hear anything pop. She is having dull pain.   She mentioned she had not heard from the GYN but they attempted to call her 3 times she verbalized her voicemail is full.      Past Medical History:  Diagnosis Date   Depression    Fibroids    Migraines    Prediabetes      Family History  Problem Relation Age of Onset   Alzheimer's disease Mother    Diabetes Father    Hypertension Father    Kidney disease Sister    Hypertension Sister    Heart failure Sister    Diabetes Sister    Kidney disease Sister    Breast cancer Sister    Peripheral Artery Disease Sister    Hyperlipidemia Sister      Current Outpatient Medications:    Cholecalciferol (VITAMIN D3) 250 MCG (10000 UT) capsule, Take 1 capsule (10,000 Units total) by mouth daily., Disp: 30 capsule, Rfl: 3   eletriptan (RELPAX) 20 MG tablet, Take 1 tablet by mouth at onset of headache. May repeat in 2 hours if headache persists or recurs, Disp: 10 tablet, Rfl: 2   benzonatate (TESSALON) 100 MG capsule, Take 1 capsule (100 mg total) by mouth every 8 (eight) hours. (Patient not taking: Reported on 03/13/2024), Disp: 21 capsule, Rfl: 0   tiZANidine (ZANAFLEX) 4 MG tablet, Take 1 tablet (4 mg total) by mouth every 8 (eight) hours as needed for muscle spasms.,  Disp: 30 tablet, Rfl: 1   Allergies  Allergen Reactions   Other     NAUSEA MEDICINE PRESCRIBED TO PREGNANT WOMEN-CAUSED SLURRED SPEECH   Penicillins Nausea And Vomiting     Review of Systems  Constitutional: Negative.   HENT: Negative.    Eyes: Negative.   Respiratory: Negative.    Cardiovascular: Negative.   Gastrointestinal: Negative.   Musculoskeletal:  Positive for back pain (low back middle pain).  Psychiatric/Behavioral: Negative.       Today's Vitals   03/13/24 1405 03/13/24 1432  BP: 120/84   Pulse: 72   Temp: 98.6 F (37 C)   TempSrc: Oral   Weight: 204 lb (92.5 kg)   Height: 5' 7.8" (1.722 m)   PainSc: 10-Worst pain ever 10-Worst pain ever  PainLoc: Back    Body mass index is 31.2 kg/m.  Wt Readings from Last 3 Encounters:  03/13/24 204 lb (92.5 kg)  12/07/23 197 lb (89.4 kg)  11/29/23 197 lb 9.6 oz (89.6 kg)     Objective:  Physical Exam Vitals and nursing note reviewed.  Constitutional:      General: She is not in acute distress.    Appearance: Normal appearance.  Cardiovascular:     Rate and  Rhythm: Normal rate and regular rhythm.     Pulses: Normal pulses.     Heart sounds: Normal heart sounds. No murmur heard. Pulmonary:     Effort: Pulmonary effort is normal. No respiratory distress.     Breath sounds: Normal breath sounds. No wheezing.  Musculoskeletal:     Lumbar back: Decreased range of motion (she has guarded movements). Negative right straight leg raise test and negative left straight leg raise test.  Skin:    General: Skin is warm and dry.     Capillary Refill: Capillary refill takes less than 2 seconds.  Neurological:     General: No focal deficit present.     Mental Status: She is alert and oriented to person, place, and time.     Cranial Nerves: No cranial nerve deficit.     Motor: No weakness.  Psychiatric:        Mood and Affect: Mood normal.        Behavior: Behavior normal.        Thought Content: Thought content normal.         Judgment: Judgment normal.         Assessment And Plan:  Acute bilateral low back pain without sciatica Assessment & Plan: Will treat with muscle relaxer and toradol 60 mg, she has pain with range of motion. Encouraged to stretch regularly as well. Negative straight leg raise  Orders: -     Ketorolac Tromethamine -     tiZANidine HCl; Take 1 tablet (4 mg total) by mouth every 8 (eight) hours as needed for muscle spasms.  Dispense: 30 tablet; Refill: 1    Return if symptoms worsen or fail to improve.  Patient was given opportunity to ask questions. Patient verbalized understanding of the plan and was able to repeat key elements of the plan. All questions were answered to their satisfaction.    Jeanell Sparrow, FNP, have reviewed all documentation for this visit. The documentation on 03/13/24 for the exam, diagnosis, procedures, and orders are all accurate and complete.   IF YOU HAVE BEEN REFERRED TO A SPECIALIST, IT MAY TAKE 1-2 WEEKS TO SCHEDULE/PROCESS THE REFERRAL. IF YOU HAVE NOT HEARD FROM US/SPECIALIST IN TWO WEEKS, PLEASE GIVE [place] A CALL AT [phone] X [phone].

## 2024-03-14 ENCOUNTER — Encounter: Payer: Self-pay | Admitting: Nurse Practitioner

## 2024-03-20 DIAGNOSIS — M545 Low back pain, unspecified: Secondary | ICD-10-CM | POA: Insufficient documentation

## 2024-03-20 NOTE — Assessment & Plan Note (Addendum)
 Will treat with muscle relaxer and toradol 60 mg, she has pain with range of motion. Encouraged to stretch regularly as well. Negative straight leg raise

## 2024-05-11 ENCOUNTER — Encounter: Payer: Self-pay | Admitting: Nurse Practitioner

## 2024-06-08 ENCOUNTER — Encounter: Payer: Managed Care, Other (non HMO) | Admitting: Nurse Practitioner

## 2024-06-26 ENCOUNTER — Encounter: Payer: Self-pay | Admitting: Nurse Practitioner

## 2024-06-30 NOTE — Progress Notes (Signed)
 48 y.o. [name] female here for annual exam. Married. GYN ~55yr ago.  Unsure where.  No LMP recorded (lmp unknown). Patient is postmenopausal. Reports her periods stopped in her mid 55s, however hormonal testing at the time did not support menopause.  She reports Urinary frequency and trouble holding urine.  Desires STI testing.  Hx of fibroids.  She was told she has 1 grapefruit sized fibroid.  She has lower pelvic pain/cramping and spotting at times.  Last 2 months ago.  Urine sample provided: No  Abnormal bleeding: as noted Pelvic discharge or pain: as noted Breast mass, nipple discharge or skin changes : None  Sexually active: Yes being female Birth control: None Last PAP: none recently  Last mammogram: 09/26/20  Last colonoscopy: never   Exercising: Yes, Dance 2-3 times a week for 1.5 hours Smoker: No  Flowsheet Row Office Visit from 07/03/2024 in [facility]  PHQ-2 Total Score 6    Flowsheet Row Office Visit from 05/20/2022 in [facility]  PHQ-9 Total Score 15     GYN HISTORY: fibroids  OB History  Gravida Para Term Preterm AB Living  5 3 3  2 3   SAB IAB Ectopic Multiple Live Births  1 1   3     # Outcome Date GA Lbr Len/2nd Weight Sex Type Anes PTL Lv  5 SAB           4 IAB           3 Term      Vag-Spont   LIV  2 Term      Vag-Spont   LIV  1 Term      Vag-Spont   LIV   Past Medical History:  Diagnosis Date   Depression    Fibroids    Migraines    Prediabetes    History reviewed. No pertinent surgical history. Current Outpatient Medications on File Prior to Visit  Medication Sig Dispense Refill   Cholecalciferol (VITAMIN D3) 250 MCG (10000 UT) capsule Take 1 capsule (10,000 Units total) by mouth daily. 30 capsule 3   eletriptan  (RELPAX ) 20 MG tablet Take 1 tablet by mouth at onset of headache. May repeat in 2 hours if headache persists or recurs 10 tablet 2   fexofenadine (ALLEGRA) 180  MG tablet Take 180 mg by mouth.     naproxen sodium (ALEVE) 220 MG tablet Take 220 mg by mouth.     tiZANidine  (ZANAFLEX ) 4 MG tablet Take 1 tablet (4 mg total) by mouth every 8 (eight) hours as needed for muscle spasms. 30 tablet 1   No current facility-administered medications on file prior to visit.   Social History   Socioeconomic History   Marital status: Legally Separated    Spouse name: Not on file   Number of children: Not on file   Years of education: Not on file   Highest education level: Not on file  Occupational History   Not on file  Tobacco Use   Smoking status: Never   Smokeless tobacco: Never  Vaping Use   Vaping status: Never Used  Substance and Sexual Activity   Alcohol use: Yes    Comment: occassional   Drug use: No   Sexual activity: Yes  Other Topics Concern   Not on file  Social History Narrative   Not on file   Social Drivers of Health   Financial Resource Strain: Not on file  Food Insecurity: Not on file  Transportation Needs: Not on file  Physical Activity: Not on file  Stress: Not on file (10/21/2023)  Social Connections: Not on file  Intimate Partner Violence: Not on file   Family History  Problem Relation Age of Onset   Alzheimer's disease Mother    Diabetes Father    Hypertension Father    Kidney disease Sister    Hypertension Sister    Heart failure Sister    Diabetes Sister    Kidney disease Sister    Breast cancer Sister    Peripheral Artery Disease Sister    Hyperlipidemia Sister    Allergies  Allergen Reactions   Other     NAUSEA MEDICINE PRESCRIBED TO PREGNANT WOMEN-CAUSED SLURRED SPEECH   Penicillins Nausea And Vomiting     PE Today's Vitals   07/03/24 1357  BP: 118/76  Pulse: 80  Temp: (!) 97.5 F (36.4 C)  TempSrc: Oral  SpO2: 98%  Weight: 197 lb (89.4 kg)  Height: 5' 8.5 (1.74 m)   Body mass index is 29.52 kg/m.  Physical Exam Vitals reviewed. Exam conducted with a chaperone present.  Constitutional:       General: She is not in acute distress.    Appearance: Normal appearance.  HENT:     Head: Normocephalic and atraumatic.     Nose: Nose normal.  Eyes:     Extraocular Movements: Extraocular movements intact.     Conjunctiva/sclera: Conjunctivae normal.  Neck:     Thyroid: No thyroid mass, thyromegaly or thyroid tenderness.  Pulmonary:     Effort: Pulmonary effort is normal.  Chest:     Chest wall: No mass or tenderness.  Breasts:    Right: Normal. No swelling, mass, nipple discharge, skin change or tenderness.     Left: Normal. No swelling, mass, nipple discharge, skin change or tenderness.  Abdominal:     General: There is no distension.     Palpations: Abdomen is soft.     Tenderness: There is no abdominal tenderness.  Genitourinary:    General: Normal vulva.     Exam position: Lithotomy position.     Urethra: No prolapse.     Vagina: Normal. No vaginal discharge or bleeding.     Cervix: Normal. No lesion.     Uterus: Normal. Enlarged. Not tender.      Adnexa: Right adnexa normal and left adnexa normal.     Comments: 12wk uterus, posterior fibroid Musculoskeletal:        General: Normal range of motion.     Cervical back: Normal range of motion.  Lymphadenopathy:     Upper Body:     Right upper body: No axillary adenopathy.     Left upper body: No axillary adenopathy.     Lower Body: No right inguinal adenopathy. No left inguinal adenopathy.  Skin:    General: Skin is warm and dry.  Neurological:     General: No focal deficit present.     Mental Status: She is alert.  Psychiatric:        Mood and Affect: Mood normal.        Behavior: Behavior normal.       Assessment and Plan:        Well woman exam with routine gynecological exam Assessment & Plan: Cervical cancer screening performed according to ASCCP guidelines. Encouraged annual mammogram screening, overdue Colonoscopy overdue, has cologuard encouraged to complete Labs and immunizations with her  primary Encouraged safe sexual practices as indicated Encouraged healthy lifestyle practices with  diet and exercise For patients under 50yo, I recommend 1000mg  calcium daily and 600IU of vitamin D  daily.    Cervical cancer screening -     Cytology - PAP  Screening for depression -     Ambulatory referral to Behavioral Health  Screen for STD (sexually transmitted disease) -     Hepatitis B surface antigen -     Hepatitis C antibody -     RPR -     HIV Antibody (routine testing w rflx) -     Cytology - PAP  Abnormal uterine bleeding (AUB) Assessment & Plan: Will check, labs to determine if patient is postmenopausal Return to office for ultrasound +/- EMB pending hormonal studies Records request for prior ultrasound and management of fibroids   Orders: -     Endometrial biopsy; Future -     Follicle stimulating hormone -     Estradiol  -     US  PELVIS TRANSVAGINAL NON-OB (TV ONLY); Future  Fibroid -     US  PELVIS TRANSVAGINAL NON-OB (TV ONLY); Future  History of depression -     Ambulatory referral to Behavioral Health   Vera LULLA Pa, MD

## 2024-07-03 ENCOUNTER — Ambulatory Visit (INDEPENDENT_AMBULATORY_CARE_PROVIDER_SITE_OTHER): Admitting: Obstetrics and Gynecology

## 2024-07-03 ENCOUNTER — Other Ambulatory Visit (HOSPITAL_COMMUNITY)
Admission: RE | Admit: 2024-07-03 | Discharge: 2024-07-03 | Disposition: A | Discharge status: Home / Self Care | Source: Ambulatory Visit | Attending: Obstetrics and Gynecology | Admitting: Obstetrics and Gynecology

## 2024-07-03 ENCOUNTER — Encounter: Payer: Self-pay | Admitting: Obstetrics and Gynecology

## 2024-07-03 VITALS — BP 118/76 | HR 80 | Temp 97.5°F | Ht 68.5 in | Wt 197.0 lb

## 2024-07-03 DIAGNOSIS — Z8659 Personal history of other mental and behavioral disorders: Secondary | ICD-10-CM

## 2024-07-03 DIAGNOSIS — Z01419 Encounter for gynecological examination (general) (routine) without abnormal findings: Secondary | ICD-10-CM | POA: Insufficient documentation

## 2024-07-03 DIAGNOSIS — D219 Benign neoplasm of connective and other soft tissue, unspecified: Secondary | ICD-10-CM | POA: Insufficient documentation

## 2024-07-03 DIAGNOSIS — Z1331 Encounter for screening for depression: Secondary | ICD-10-CM

## 2024-07-03 DIAGNOSIS — N939 Abnormal uterine and vaginal bleeding, unspecified: Secondary | ICD-10-CM | POA: Diagnosis not present

## 2024-07-03 DIAGNOSIS — Z124 Encounter for screening for malignant neoplasm of cervix: Secondary | ICD-10-CM | POA: Insufficient documentation

## 2024-07-03 DIAGNOSIS — Z113 Encounter for screening for infections with a predominantly sexual mode of transmission: Secondary | ICD-10-CM | POA: Diagnosis not present

## 2024-07-03 NOTE — Patient Instructions (Signed)

## 2024-07-03 NOTE — Assessment & Plan Note (Signed)
 Cervical cancer screening performed according to ASCCP guidelines. Encouraged annual mammogram screening, overdue Colonoscopy overdue, has cologuard encouraged to complete Labs and immunizations with her primary Encouraged safe sexual practices as indicated Encouraged healthy lifestyle practices with diet and exercise For patients under 50yo, I recommend 1000mg  calcium daily and 600IU of vitamin D  daily.

## 2024-07-03 NOTE — Assessment & Plan Note (Addendum)
 Will check, labs to determine if patient is postmenopausal Return to office for ultrasound +/- EMB pending hormonal studies Records request for prior ultrasound and management of fibroids

## 2024-07-04 ENCOUNTER — Ambulatory Visit: Payer: Self-pay | Admitting: Obstetrics and Gynecology

## 2024-07-04 LAB — ESTRADIOL: Estradiol: <15 pg/mL

## 2024-07-04 LAB — HEPATITIS B SURFACE ANTIGEN: Hepatitis B Surface Ag: NONREACTIVE

## 2024-07-04 LAB — FOLLICLE STIMULATING HORMONE: FSH: 46.2 m[IU]/mL

## 2024-07-04 LAB — HEPATITIS C ANTIBODY: Hepatitis C Ab: NONREACTIVE

## 2024-07-04 LAB — HIV ANTIBODY (ROUTINE TESTING W REFLEX): HIV 1&2 Ab, 4th Generation: NONREACTIVE

## 2024-07-04 LAB — RPR: RPR Ser Ql: NONREACTIVE

## 2024-07-07 LAB — CYTOLOGY - PAP
Chlamydia: NEGATIVE
Comment: NEGATIVE
Comment: NEGATIVE
Comment: NEGATIVE
Comment: NORMAL
Diagnosis: NEGATIVE
High risk HPV: NEGATIVE
Neisseria Gonorrhea: NEGATIVE
Trichomonas: NEGATIVE

## 2024-07-31 ENCOUNTER — Ambulatory Visit: Admitting: Nurse Practitioner

## 2024-07-31 ENCOUNTER — Encounter: Payer: Self-pay | Admitting: Nurse Practitioner

## 2024-07-31 VITALS — BP 100/60 | HR 68 | Temp 98.4°F | Ht 68.0 in | Wt 201.4 lb

## 2024-07-31 DIAGNOSIS — M545 Low back pain, unspecified: Secondary | ICD-10-CM

## 2024-07-31 DIAGNOSIS — E559 Vitamin D deficiency, unspecified: Secondary | ICD-10-CM | POA: Diagnosis not present

## 2024-07-31 DIAGNOSIS — E6609 Other obesity due to excess calories: Secondary | ICD-10-CM

## 2024-07-31 DIAGNOSIS — Z862 Personal history of diseases of the blood and blood-forming organs and certain disorders involving the immune mechanism: Secondary | ICD-10-CM

## 2024-07-31 DIAGNOSIS — M62838 Other muscle spasm: Secondary | ICD-10-CM

## 2024-07-31 DIAGNOSIS — R7309 Other abnormal glucose: Secondary | ICD-10-CM | POA: Diagnosis not present

## 2024-07-31 DIAGNOSIS — Z6830 Body mass index (BMI) 30.0-30.9, adult: Secondary | ICD-10-CM

## 2024-07-31 DIAGNOSIS — Z139 Encounter for screening, unspecified: Secondary | ICD-10-CM

## 2024-07-31 DIAGNOSIS — E66811 Obesity, class 1: Secondary | ICD-10-CM

## 2024-07-31 DIAGNOSIS — Z1211 Encounter for screening for malignant neoplasm of colon: Secondary | ICD-10-CM

## 2024-07-31 DIAGNOSIS — E7800 Pure hypercholesterolemia, unspecified: Secondary | ICD-10-CM

## 2024-07-31 DIAGNOSIS — Z0000 Encounter for general adult medical examination without abnormal findings: Secondary | ICD-10-CM | POA: Diagnosis not present

## 2024-07-31 DIAGNOSIS — F419 Anxiety disorder, unspecified: Secondary | ICD-10-CM

## 2024-07-31 DIAGNOSIS — F32A Depression, unspecified: Secondary | ICD-10-CM

## 2024-07-31 MED ORDER — TRIAMCINOLONE ACETONIDE 40 MG/ML IJ SUSP
40.0000 mg | Freq: Once | INTRAMUSCULAR | Status: AC
  Administered 2024-07-31: 40 mg via INTRAMUSCULAR

## 2024-07-31 MED ORDER — CYCLOBENZAPRINE HCL 10 MG PO TABS: 10.0000 mg | ORAL_TABLET | Freq: Three times a day (TID) | ORAL | 0 refills | Status: DC | PRN

## 2024-07-31 MED ORDER — TRAMADOL HCL 50 MG PO TABS: 50.0000 mg | ORAL_TABLET | Freq: Four times a day (QID) | ORAL | 0 refills | Status: DC | PRN

## 2024-07-31 MED ORDER — PROPRANOLOL HCL 10 MG PO TABS: 10.0000 mg | ORAL_TABLET | Freq: Every day | ORAL | 1 refills | Status: AC | PRN

## 2024-07-31 NOTE — Progress Notes (Signed)
 LILLETTE Kristeen JINNY Gladis, CMA,acting as a Neurosurgeon for Brooke Ada, FNP.,have documented all relevant documentation on the behalf of Brooke Ada, FNP,as directed by  Brooke Ada, FNP while in the presence of Brooke Ada, FNP.  Subjective:    Patient ID: Brooke Lozano , female    DOB: 06-Sep-1976 , 48 y.o.   MRN: [id number]  Chief Complaint  Patient presents with   Annual Exam    Patient presents today for HM, Patient reports compliance with medication. Patient denies any chest pain, SOB, or headaches. Patient reports she is having a lot of lower back pain and muscle spasms, she reports muscle relaxer is not working. Patient refused to get undressed due to back pain.     HPI Discussed the use of AI scribe software for clinical note transcription with the patient, who gave verbal consent to proceed.  History of Present Illness Brooke Lozano is a 48 year old female who presents for an annual physical exam.  She has been experiencing ongoing back pain for about a week, which began after lying down all day on her day off. The pain has not subsided and was exacerbated by similar activity yesterday. She reports that there is one spot on her back that is always tender, and she feels like the pain is coming from that spot and affecting the whole area. She has been using tizanidine  prescribed in March, but it has not been effective. She recalls a previous medication that worked well but cannot remember its name. She has not had any imaging or thorough evaluation of her back pain in the past.  She has a history of fibroids and was seen by a gynecologist at [facility] in 2021. She is postmenopausal and has not experienced any abnormal vaginal bleeding. She has not yet completed her mammogram or colonoscopy. She inquires about using Cologuard as an alternative to colonoscopy.  She engages in physical activity by dancing two to three times a week and does not follow a specific diet. She experiences  muscle cramps, particularly in her calf, which she attributes to dehydration. She has been taking ibuprofen for pain relief but finds it ineffective.  She expresses interest in a medication for anxiety, mentioning propranolol  as a potential option, which her daughter has used successfully. No use of Q-tips for ear cleaning and reports a history of wax buildup in her ear.   She is being followed by GYN      Past Medical History:  Diagnosis Date   Depression    Fibroids    Migraines    Prediabetes      Family History  Problem Relation Age of Onset   Alzheimer's disease Mother    Diabetes Father    Hypertension Father    Kidney disease Sister    Hypertension Sister    Heart failure Sister    Diabetes Sister    Kidney disease Sister    Breast cancer Sister    Peripheral Artery Disease Sister    Hyperlipidemia Sister      Current Outpatient Medications:    Cholecalciferol (VITAMIN D3) 250 MCG (10000 UT) capsule, Take 1 capsule (10,000 Units total) by mouth daily., Disp: 30 capsule, Rfl: 3   eletriptan  (RELPAX ) 20 MG tablet, Take 1 tablet by mouth at onset of headache. May repeat in 2 hours if headache persists or recurs, Disp: 10 tablet, Rfl: 2   fexofenadine (ALLEGRA) 180 MG tablet, Take 180 mg by mouth., Disp: , Rfl:    naproxen sodium (  ALEVE) 220 MG tablet, Take 220 mg by mouth., Disp: , Rfl:    propranolol  (INDERAL ) 10 MG tablet, Take 1 tablet (10 mg total) by mouth daily as needed., Disp: 30 tablet, Rfl: 1   metFORMIN  (GLUCOPHAGE ) 500 MG tablet, Take 1 tablet (500 mg total) by mouth daily with breakfast., Disp: 90 tablet, Rfl: 1   Allergies  Allergen Reactions   Other     NAUSEA MEDICINE PRESCRIBED TO PREGNANT WOMEN-CAUSED SLURRED SPEECH   Penicillins Nausea And Vomiting      The patient states she uses post menopausal status for birth control. No LMP recorded (lmp unknown). Patient is postmenopausal.. Negative for Dysmenorrhea and Negative for Menorrhagia. Negative  for: breast discharge, breast lump(s), breast pain and breast self exam. Associated symptoms include abnormal vaginal bleeding. Pertinent negatives include abnormal bleeding (hematology), anxiety, decreased libido, depression, difficulty falling sleep, dyspareunia, history of infertility, nocturia, sexual dysfunction, sleep disturbances, urinary incontinence, urinary urgency, vaginal discharge and vaginal itching. Diet regular. The patient states her exercise level is moderate with dance 2-3 days a week.   The patient's tobacco use is:  Social History   Tobacco Use  Smoking Status Never  Smokeless Tobacco Never   She has been exposed to passive smoke. The patient's alcohol use is:  Social History   Substance and Sexual Activity  Alcohol Use Yes   Comment: occassional  . Additional information: Last pap 07/03/2024, next one scheduled for 07/04/2027.    Review of Systems  Constitutional: Negative.   HENT: Negative.    Eyes: Negative.   Respiratory: Negative.    Cardiovascular: Negative.   Gastrointestinal: Negative.   Endocrine: Negative.   Genitourinary: Negative.   Musculoskeletal:  Positive for arthralgias (right hip pain).  Skin: Negative.   Allergic/Immunologic: Negative.   Neurological:  Positive for headaches. Negative for numbness.  Hematological: Negative.   Psychiatric/Behavioral: Negative.      Today's Vitals   07/31/24 1016  BP: 100/60  Pulse: 68  Temp: 98.4 F (36.9 C)  TempSrc: Oral  Weight: 201 lb 6.4 oz (91.4 kg)  Height: 5' 8 (1.727 m)  PainSc: 8   PainLoc: Back   Body mass index is 30.62 kg/m.  Wt Readings from Last 3 Encounters:  08/01/24 199 lb (90.3 kg)  07/31/24 201 lb 6.4 oz (91.4 kg)  07/03/24 197 lb (89.4 kg)     Objective:  Physical Exam Vitals and nursing note reviewed.  Constitutional:      General: She is not in acute distress.    Appearance: Normal appearance. She is well-developed.  HENT:     Head: Normocephalic and atraumatic.      Right Ear: Hearing, tympanic membrane, ear canal and external ear normal. There is no impacted cerumen.     Left Ear: Hearing, tympanic membrane, ear canal and external ear normal. There is no impacted cerumen.     Nose: Nose normal.     Mouth/Throat:     Mouth: Mucous membranes are moist.  Eyes:     General: Lids are normal.     Extraocular Movements: Extraocular movements intact.     Conjunctiva/sclera: Conjunctivae normal.     Pupils: Pupils are equal, round, and reactive to light.     Funduscopic exam:    Right eye: No papilledema.        Left eye: No papilledema.  Neck:     Thyroid: No thyroid mass.     Vascular: No carotid bruit.  Cardiovascular:     Rate and  Rhythm: Normal rate and regular rhythm.     Pulses: Normal pulses.     Heart sounds: Normal heart sounds. No murmur heard. Pulmonary:     Effort: Pulmonary effort is normal. No respiratory distress.     Breath sounds: Normal breath sounds. No wheezing.  Abdominal:     General: Abdomen is flat. Bowel sounds are normal. There is no distension.     Palpations: Abdomen is soft.     Tenderness: There is no abdominal tenderness.  Genitourinary:    Comments: Deferred followed by GYN Musculoskeletal:        General: No swelling. Normal range of motion.     Cervical back: Full passive range of motion without pain, normal range of motion and neck supple.     Lumbar back: Tenderness present.     Right hip: Normal. No deformity or bony tenderness. Normal range of motion. Normal strength.     Left hip: Normal. No deformity or bony tenderness. Normal range of motion. Normal strength.     Right lower leg: No edema.     Left lower leg: No edema.  Skin:    General: Skin is warm and dry.     Capillary Refill: Capillary refill takes less than 2 seconds.  Neurological:     General: No focal deficit present.     Mental Status: She is alert and oriented to person, place, and time.     Cranial Nerves: No cranial nerve deficit.      Sensory: No sensory deficit.     Motor: No weakness.  Psychiatric:        Mood and Affect: Mood normal.        Behavior: Behavior normal.        Thought Content: Thought content normal.        Judgment: Judgment normal.         07/31/2024   10:36 AM 07/03/2024    2:58 PM 07/03/2024    1:53 PM 06/02/2023   11:09 AM 05/20/2022   10:06 AM  Depression screen PHQ 2/9  Decreased Interest 3 3 3 3 2   Down, Depressed, Hopeless 3 3 3  2   PHQ - 2 Score 6 6 6 3 4   Altered sleeping 2 2   2   Tired, decreased energy 3 3   2   Change in appetite 0 0   0  Feeling bad or failure about yourself  1 1   2   Trouble concentrating 3 3   2   Moving slowly or fidgety/restless 0 0   3  Suicidal thoughts 0 0   0  PHQ-9 Score 15 15   15   Difficult doing work/chores Somewhat difficult Extremely dIfficult   Extremely dIfficult      07/31/2024   10:37 AM 06/02/2023   11:10 AM  GAD 7 : Generalized Anxiety Score  Nervous, Anxious, on Edge 1 --  Control/stop worrying 1   Worry too much - different things 1   Trouble relaxing 1   Restless 1   Easily annoyed or irritable 1   Afraid - awful might happen 1   Total GAD 7 Score 7   Anxiety Difficulty Somewhat difficult      Assessment And Plan:     Encounter for annual health examination Assessment & Plan: Postmenopausal with no abnormal bleeding. Mammogram and colonoscopy screenings overdue. Engages in regular exercise. No specific diet. - Order Cologuard kit for colorectal cancer screening. - Provide contact information for scheduling mammogram. - Provide  contact information for previous GYN records. - Check labs for routine screening.  Orders: -     CBC with Differential/Platelet -     CMP14+EGFR  History of iron deficiency anemia -     Iron, TIBC and Ferritin Panel  Abnormal glucose Assessment & Plan: HgbA1c is stable. Continue focusing on healthy diet.   Orders: -     Hemoglobin A1c  Elevated LDL cholesterol level Assessment &  Plan: Cholesterol levels are stable, continue focusing on low fat diet. A low fat, low cholesterol is discussed with the patient   Orders: -     Lipid panel  Class 1 obesity due to excess calories with body mass index (BMI) of 30.0 to 30.9 in adult, unspecified whether serious comorbidity present Assessment & Plan: She is encouraged to strive for BMI less than 30 to decrease cardiac risk. Advised to aim for at least 150 minutes of exercise per week.    Vitamin D  deficiency Assessment & Plan: Will check vitamin D  level and supplement as needed.    Also encouraged to spend 15 minutes in the sun daily.    Orders: -     VITAMIN D  25 Hydroxy (Vit-D Deficiency, Fractures)  Acute bilateral low back pain without sciatica Assessment & Plan: Acute muscular low back pain with tender spot. Tizanidine  ineffective. Discussed cyclobenzaprine , tramadol , and steroid injection with potential side effects. - Prescribe cyclobenzaprine  10 mg for muscle spasms. - Refer to sports medicine specialist for further evaluation. - Prescribe tramadol  for severe pain as needed. - Administer steroid injection for inflammation and pain relief.  Orders: -     Ambulatory referral to Sports Medicine -     Triamcinolone  Acetonide  Muscle spasm Assessment & Plan: Rx for muscle relaxer sent to pharmacy   Colon cancer screening -     Cologuard; Future  Encounter for screening -     Hepatitis B surface antibody,qualitative  Anxiety and depression Assessment & Plan: Depression screen score 15. No counseling. Interested in propranolol  for anxiety. Blood pressure 100/60, discussed propranolol  side effects. - Prescribe propranolol  10 mg as needed for anxiety. - Provide resources for finding a counselor, including [facility]. - Suggest checking husband's insurance for counseling options. - Schedule follow-up in 6 weeks to assess propranolol  efficacy.  Orders: -     Propranolol  HCl; Take 1 tablet  (10 mg total) by mouth daily as needed.  Dispense: 30 tablet; Refill: 1    Return for 1 year physical. Patient was given opportunity to ask questions. Patient verbalized understanding of the plan and was able to repeat key elements of the plan. All questions were answered to their satisfaction.   Brooke Ada, FNP  I, Brooke Ada, FNP, have reviewed all documentation for this visit. The documentation on 07/31/24 for the exam, diagnosis, procedures, and orders are all accurate and complete.

## 2024-07-31 NOTE — Patient Instructions (Addendum)
 You were seen at [facility] and seen Dr. Armond Call [facility] at  [phone] to schedule your Mammogram [email or web address] You can take tylenol  4000 mg a day safely.    Health Maintenance  Topic Date Due   Hepatitis B Vaccine (1 of 3 - 19+ 3-dose series) Never done   Colon Cancer Screening  Never done   Mammogram  09/26/2021   COVID-19 Vaccine (5 - 2024-25 season) 08/15/2023   Flu Shot  03/13/2025*   Pap with HPV screening  07/03/2029   DTaP/Tdap/Td vaccine (2 - Td or Tdap) 05/20/2032   Hepatitis C Screening  Completed   HIV Screening  Completed   Pneumococcal Vaccine  Aged Out   HPV Vaccine  Aged Out   Meningitis B Vaccine  Aged Out  *Topic was postponed. The date shown is not the original due date.

## 2024-08-01 ENCOUNTER — Encounter: Payer: Self-pay | Admitting: Obstetrics and Gynecology

## 2024-08-01 ENCOUNTER — Other Ambulatory Visit: Payer: Self-pay | Admitting: Medical Genetics

## 2024-08-01 ENCOUNTER — Ambulatory Visit (INDEPENDENT_AMBULATORY_CARE_PROVIDER_SITE_OTHER): Admitting: Obstetrics and Gynecology

## 2024-08-01 ENCOUNTER — Ambulatory Visit (INDEPENDENT_AMBULATORY_CARE_PROVIDER_SITE_OTHER)

## 2024-08-01 VITALS — BP 116/76 | HR 80 | Temp 97.6°F | Wt 199.0 lb

## 2024-08-01 DIAGNOSIS — N939 Abnormal uterine and vaginal bleeding, unspecified: Secondary | ICD-10-CM | POA: Diagnosis not present

## 2024-08-01 DIAGNOSIS — D219 Benign neoplasm of connective and other soft tissue, unspecified: Secondary | ICD-10-CM

## 2024-08-01 LAB — CBC WITH DIFFERENTIAL/PLATELET
Basophils Absolute: 0.1 x10E3/uL (ref 0.0–0.2)
Basos: 1 %
EOS (ABSOLUTE): 0.1 x10E3/uL (ref 0.0–0.4)
Eos: 2 %
Hematocrit: 41.8 % (ref 34.0–46.6)
Hemoglobin: 13.6 g/dL (ref 11.1–15.9)
Immature Grans (Abs): 0 x10E3/uL (ref 0.0–0.1)
Immature Granulocytes: 0 %
Lymphocytes Absolute: 2.5 x10E3/uL (ref 0.7–3.1)
Lymphs: 43 %
MCH: 28.3 pg (ref 26.6–33.0)
MCHC: 32.5 g/dL (ref 31.5–35.7)
MCV: 87 fL (ref 79–97)
Monocytes Absolute: 0.4 x10E3/uL (ref 0.1–0.9)
Monocytes: 7 %
Neutrophils Absolute: 2.7 x10E3/uL (ref 1.4–7.0)
Neutrophils: 47 %
Platelets: 225 x10E3/uL (ref 150–450)
RBC: 4.8 x10E6/uL (ref 3.77–5.28)
RDW: 13.1 % (ref 11.7–15.4)
WBC: 5.8 x10E3/uL (ref 3.4–10.8)

## 2024-08-01 LAB — CMP14+EGFR
ALT: 18 IU/L (ref 0–32)
AST: 15 IU/L (ref 0–40)
Albumin: 4.3 g/dL (ref 3.9–4.9)
Alkaline Phosphatase: 70 IU/L (ref 44–121)
BUN/Creatinine Ratio: 18 (ref 9–23)
BUN: 10 mg/dL (ref 6–24)
Bilirubin Total: 0.3 mg/dL (ref 0.0–1.2)
CO2: 25 mmol/L (ref 20–29)
Calcium: 9.9 mg/dL (ref 8.7–10.2)
Chloride: 103 mmol/L (ref 96–106)
Creatinine, Ser: 0.57 mg/dL (ref 0.57–1.00)
Globulin, Total: 3.1 g/dL (ref 1.5–4.5)
Glucose: 84 mg/dL (ref 70–99)
Potassium: 4.3 mmol/L (ref 3.5–5.2)
Sodium: 138 mmol/L (ref 134–144)
Total Protein: 7.4 g/dL (ref 6.0–8.5)
eGFR: 111 mL/min/1.73 (ref >59)

## 2024-08-01 LAB — IRON,TIBC AND FERRITIN PANEL
Ferritin: 65 ng/mL (ref 15–150)
Iron Saturation: 19 % (ref 15–55)
Iron: 58 ug/dL (ref 27–159)
Total Iron Binding Capacity: 305 ug/dL (ref 250–450)
UIBC: 247 ug/dL (ref 131–425)

## 2024-08-01 LAB — LIPID PANEL
Chol/HDL Ratio: 3.1 ratio (ref 0.0–4.4)
Cholesterol, Total: 153 mg/dL (ref 100–199)
HDL: 50 mg/dL (ref >39)
LDL Chol Calc (NIH): 87 mg/dL (ref 0–99)
Triglycerides: 87 mg/dL (ref 0–149)
VLDL Cholesterol Cal: 16 mg/dL (ref 5–40)

## 2024-08-01 LAB — HEMOGLOBIN A1C
Est. average glucose Bld gHb Est-mCnc: 137 mg/dL
Hgb A1c MFr Bld: 6.4 % — ABNORMAL HIGH (ref 4.8–5.6)

## 2024-08-01 LAB — VITAMIN D 25 HYDROXY (VIT D DEFICIENCY, FRACTURES): Vit D, 25-Hydroxy: 33.9 ng/mL (ref 30.0–100.0)

## 2024-08-01 LAB — HEPATITIS B SURFACE ANTIBODY,QUALITATIVE: Hep B Surface Ab, Qual: REACTIVE

## 2024-08-01 NOTE — Progress Notes (Signed)
 48 y.o. [name] female with AUB, hx of fibroids here for TVUS+EMB. Married. Last GYN care ~47yr ago.  Unsure where.  No LMP recorded (lmp unknown). Patient is postmenopausal. At annual exam, she reported: Reports her periods stopped in her mid 49s, however hormonal testing at the time did not support menopause.  Hx of fibroids.  She was told she has 1 grapefruit sized fibroid.  She has lower pelvic pain/cramping and spotting at times.  Last spotting was 2 months ago.  07/03/24 FSH 49, estradiol  <15  Today, she reports that she was mistaking her discharge for spotting. She has not seen any bleeding over the last 2 years, and she is sure about this. She declines EMB. She is worried that her fibroid is making her stomach protrude. She also reports feeling some pelvic pressure.  Sexually active: Yes Birth control: None  Smoker: No  GYN HISTORY: fibroids  OB History  Gravida Para Term Preterm AB Living  5 3 3  2 3   SAB IAB Ectopic Multiple Live Births  1 1   3     # Outcome Date GA Lbr Len/2nd Weight Sex Type Anes PTL Lv  5 SAB           4 IAB           3 Term      Vag-Spont   LIV  2 Term      Vag-Spont   LIV  1 Term      Vag-Spont   LIV   Past Medical History:  Diagnosis Date   Depression    Fibroids    Migraines    Prediabetes    History reviewed. No pertinent surgical history. Current Outpatient Medications on File Prior to Visit  Medication Sig Dispense Refill   Cholecalciferol (VITAMIN D3) 250 MCG (10000 UT) capsule Take 1 capsule (10,000 Units total) by mouth daily. 30 capsule 3   eletriptan  (RELPAX ) 20 MG tablet Take 1 tablet by mouth at onset of headache. May repeat in 2 hours if headache persists or recurs 10 tablet 2   fexofenadine (ALLEGRA) 180 MG tablet Take 180 mg by mouth.     naproxen sodium (ALEVE) 220 MG tablet Take 220 mg by mouth.     propranolol  (INDERAL ) 10 MG tablet Take 1 tablet (10 mg total) by mouth daily as needed. 30 tablet 1   No current  facility-administered medications on file prior to visit.   Allergies  Allergen Reactions   Other     NAUSEA MEDICINE PRESCRIBED TO PREGNANT WOMEN-CAUSED SLURRED SPEECH   Penicillins Nausea And Vomiting     PE Today's Vitals   08/01/24 1003  BP: 116/76  Pulse: 80  Temp: 97.6 F (36.4 C)  TempSrc: Oral  SpO2: 97%  Weight: 199 lb (90.3 kg)   Body mass index is 30.26 kg/m.  Physical Exam Vitals reviewed.  Constitutional:      General: She is not in acute distress.    Appearance: Normal appearance.  HENT:     Head: Normocephalic and atraumatic.     Nose: Nose normal.  Eyes:     Extraocular Movements: Extraocular movements intact.     Conjunctiva/sclera: Conjunctivae normal.  Pulmonary:     Effort: Pulmonary effort is normal.  Musculoskeletal:        General: Normal range of motion.     Cervical back: Normal range of motion.  Neurological:     General: No focal deficit present.     Mental Status:  She is alert.  Psychiatric:        Mood and Affect: Mood normal.        Behavior: Behavior normal.     08/01/24 TVUS:  Indications: AUB, Fibroids  Findings:   Uterus: 15.0 x 10.0 x 9.1 cm.  10.7 x 9.0 cm intramural mid body fibroid present. Endometrial thickness: Obscured by presence of fibroid. Left ovary: 3.6 x 2.2 x 2.1 cm.  Normal-appearing. Right ovary: 4.2 x 2.0 x 2.1 cm, normal-appearing. No free fluid.  Impression:  10.7 cm uterine fibroid present.  Vera LULLA Pa, MD  Assessment and Plan:        Abnormal uterine bleeding (AUB) Assessment & Plan: 07/03/24 FSH 48, estradiol  <15 c/w menopause US  today with 10cm midbody fibroid Patient originally reported spotting at annual exam, however today she denies any history spotting or vaginal bleeding. She believes it was just discharge. Decline endometrial biopsy. Reviewed with patient if she has any future vaginal bleeding then we should proceed with endometrial sampling.    Fibroid Assessment &  Plan: Ultrasound reviewed with patient. Records requested for prior ultrasound and management of fibroids > NONE received She is reporting some bulk symptoms including pelvic pressure and abdominal protrusion. Discussed expectant management, hysterectomy and [place]. Would recommend EMB prior to these procedures. Patient will follow-up when she has decided.    30 min total time was spent for this patient encounter, including preparation, face-to-face counseling with the patient, coordination of care, and documentation of the encounter.   Vera LULLA Pa, MD

## 2024-08-01 NOTE — Assessment & Plan Note (Addendum)
 Ultrasound reviewed with patient. Records requested for prior ultrasound and management of fibroids > NONE received She is reporting some bulk symptoms including pelvic pressure and abdominal protrusion. Discussed expectant management, hysterectomy and [place]. Would recommend EMB prior to these procedures. Patient will follow-up when she has decided.

## 2024-08-01 NOTE — Assessment & Plan Note (Addendum)
 07/03/24 FSH 49, estradiol  <15 c/w menopause US  today with 10cm midbody fibroid Patient originally reported spotting at annual exam, however today she denies any history spotting or vaginal bleeding. She believes it was just discharge. Decline endometrial biopsy. Reviewed with patient if she has any future vaginal bleeding then we should proceed with endometrial sampling.

## 2024-08-02 ENCOUNTER — Ambulatory Visit: Payer: Self-pay | Admitting: Nurse Practitioner

## 2024-08-02 MED ORDER — METFORMIN HCL 500 MG PO TABS: 500.0000 mg | ORAL_TABLET | Freq: Every day | ORAL | 1 refills | Status: AC

## 2024-08-07 ENCOUNTER — Other Ambulatory Visit (HOSPITAL_COMMUNITY)
Admission: RE | Admit: 2024-08-07 | Discharge: 2024-08-07 | Disposition: A | Discharge status: Home / Self Care | Payer: Self-pay | Source: Ambulatory Visit | Attending: Oncology | Admitting: Oncology

## 2024-08-10 DIAGNOSIS — F32A Depression, unspecified: Secondary | ICD-10-CM | POA: Insufficient documentation

## 2024-08-10 DIAGNOSIS — E6609 Other obesity due to excess calories: Secondary | ICD-10-CM | POA: Insufficient documentation

## 2024-08-10 NOTE — Assessment & Plan Note (Signed)
 Rx for muscle relaxer sent to pharmacy

## 2024-08-10 NOTE — Assessment & Plan Note (Signed)
 Acute muscular low back pain with tender spot. Tizanidine  ineffective. Discussed cyclobenzaprine , tramadol , and steroid injection with potential side effects. - Prescribe cyclobenzaprine  10 mg for muscle spasms. - Refer to sports medicine specialist for further evaluation. - Prescribe tramadol  for severe pain as needed. - Administer steroid injection for inflammation and pain relief.

## 2024-08-10 NOTE — Assessment & Plan Note (Signed)
HgbA1c is stable. Continue focusing on healthy diet.

## 2024-08-10 NOTE — Assessment & Plan Note (Signed)
 Depression screen score 15. No counseling. Interested in propranolol  for anxiety. Blood pressure 100/60, discussed propranolol  side effects. - Prescribe propranolol  10 mg as needed for anxiety. - Provide resources for finding a counselor, including [employer]. - Suggest checking husband's insurance for counseling options. - Schedule follow-up in 6 weeks to assess propranolol  efficacy.

## 2024-08-10 NOTE — Assessment & Plan Note (Signed)
 Will check vitamin D  level and supplement as needed.    Also encouraged to spend 15 minutes in the sun daily.

## 2024-08-10 NOTE — Assessment & Plan Note (Signed)
 She is encouraged to strive for BMI less than 30 to decrease cardiac risk. Advised to aim for at least 150 minutes of exercise per week.

## 2024-08-10 NOTE — Assessment & Plan Note (Signed)
 Postmenopausal with no abnormal bleeding. Mammogram and colonoscopy screenings overdue. Engages in regular exercise. No specific diet. - Order Cologuard kit for colorectal cancer screening. - Provide contact information for scheduling mammogram. - Provide contact information for previous GYN records. - Check labs for routine screening.

## 2024-08-10 NOTE — Assessment & Plan Note (Signed)
Cholesterol levels are stable, continue focusing on low fat diet. A low fat, low cholesterol is discussed with the patient

## 2024-08-15 LAB — GENECONNECT MOLECULAR SCREEN: Genetic Analysis Overall Interpretation: NEGATIVE

## 2024-08-18 NOTE — Progress Notes (Signed)
   LILLETTE Ileana Collet, PhD, LAT, ATC acting as a scribe for Artist Lloyd, MD.  Brooke Lozano is a 48 y.o. female who presents to [facility] at [facility] today for LBP ongoing since around the beginning of Aug. This is an ongoing issue, w/ an acute flare. Pt locates pain to across the whole low back. She notes a particularly painful area on the L-side, very TTP.   Radiating pain: no LE numbness/tingling: no LE weakness: no Aggravates: improper lifting, too much rest in bed Treatments tried: tizanidine , IBU, ice, heat, Tylenol   Pertinent review of systems: No fevers or chills  Relevant historical information: Migraine headache.  Chronic back pain.   Exam:  BP 96/68   Pulse 70   Ht 5' 8 (1.727 m)   Wt 194 lb (88 kg)   LMP  (LMP Unknown)   SpO2 99%   BMI 29.50 kg/m  General: Well Developed, well nourished, and in no acute distress.   MSK: L-spine: Normal appearing Nontender palpation spinal midline. Mildly tender palpation palpation left lumbar paraspinal musculature. Lower extremity strength is intact. Reflexes are intact.    Lab and Radiology Results  X-ray images lumbar spine obtained today personally and independently interpreted. No acute fractures.  Mild DDD L5-S1. Await formal radiology review    Assessment and Plan: 48 y.o. female with chronic left low back pain with acute exacerbation.  Plan to treat with heating pad, TENS unit, physical therapy, cyclobenzaprine  and ibuprofen.  Recheck in 2 months unless better.   PDMP not reviewed this encounter. Orders Placed This Encounter  Procedures   DG Lumbar Spine 2-3 Views    Standing Status:   Future    Number of Occurrences:   1    Expiration Date:   09/20/2024    Reason for Exam (SYMPTOM  OR DIAGNOSIS REQUIRED):   low back pain    Preferred imaging location?:   [place]    Is patient pregnant?:   No   Ambulatory referral to Physical Therapy    Referral Priority:   Routine     Referral Type:   Physical Medicine    Referral Reason:   Specialty Services Required    Requested Specialty:   Physical Therapy    Number of Visits Requested:   1   Meds ordered this encounter  Medications   cyclobenzaprine  (FLEXERIL ) 10 MG tablet    Sig: Take 1 tablet (10 mg total) by mouth 3 (three) times daily as needed for muscle spasms.    Dispense:  90 tablet    Refill:  1     Discussed warning signs or symptoms. Please see discharge instructions. Patient expresses understanding.   The above documentation has been reviewed and is accurate and complete Artist Lloyd, M.D.

## 2024-08-21 ENCOUNTER — Ambulatory Visit (INDEPENDENT_AMBULATORY_CARE_PROVIDER_SITE_OTHER)

## 2024-08-21 ENCOUNTER — Ambulatory Visit: Admitting: Family Medicine

## 2024-08-21 VITALS — BP 96/68 | HR 70 | Ht 68.0 in | Wt 194.0 lb

## 2024-08-21 DIAGNOSIS — M545 Low back pain, unspecified: Secondary | ICD-10-CM

## 2024-08-21 DIAGNOSIS — G8929 Other chronic pain: Secondary | ICD-10-CM | POA: Diagnosis not present

## 2024-08-21 MED ORDER — CYCLOBENZAPRINE HCL 10 MG PO TABS: 10.0000 mg | ORAL_TABLET | Freq: Three times a day (TID) | ORAL | 1 refills | Status: AC | PRN

## 2024-08-21 NOTE — Patient Instructions (Addendum)
 Thank you for coming in today.   I've referred you to Physical Therapy.  Let us  know if you don't hear from them in one week.   I've sent a prescription for Cyclobenzaprine  to your pharmacy.   Check back in 2 months  Try using a heating pad  TENS UNIT: This is helpful for muscle pain and spasm.   Search and Purchase a TENS 7000 2nd edition at  www.tenspros.com or www.Amazon.com It should be less than $30.     TENS unit instructions: Do not shower or bathe with the unit on Turn the unit off before removing electrodes or batteries If the electrodes lose stickiness add a drop of water to the electrodes after they are disconnected from the unit and place on plastic sheet. If you continued to have difficulty, call the TENS unit company to purchase more electrodes. Do not apply lotion on the skin area prior to use. Make sure the skin is clean and dry as this will help prolong the life of the electrodes. After use, always check skin for unusual red areas, rash or other skin difficulties. If there are any skin problems, does not apply electrodes to the same area. Never remove the electrodes from the unit by pulling the wires. Do not use the TENS unit or electrodes other than as directed. Do not change electrode placement without consultating your therapist or physician. Keep 2 fingers with between each electrode. Wear time ratio is 2:1, on to off times.    For example on for 30 minutes off for 15 minutes and then on for 30 minutes off for 15 minutes

## 2024-08-28 ENCOUNTER — Ambulatory Visit: Payer: Self-pay | Admitting: Family Medicine

## 2024-08-28 NOTE — Progress Notes (Signed)
 Low back x-ray shows medium arthritis at the base of the spine.

## 2024-09-13 ENCOUNTER — Ambulatory Visit (HOSPITAL_COMMUNITY): Payer: Self-pay | Admitting: Licensed Clinical Social Worker

## 2024-09-27 ENCOUNTER — Ambulatory Visit (INDEPENDENT_AMBULATORY_CARE_PROVIDER_SITE_OTHER): Payer: Self-pay | Admitting: Clinical

## 2024-09-27 DIAGNOSIS — F419 Anxiety disorder, unspecified: Secondary | ICD-10-CM | POA: Diagnosis not present

## 2024-09-27 DIAGNOSIS — F32A Depression, unspecified: Secondary | ICD-10-CM | POA: Diagnosis not present

## 2024-09-29 ENCOUNTER — Encounter (HOSPITAL_COMMUNITY): Payer: Self-pay | Admitting: Clinical
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
## 2024-09-30 ENCOUNTER — Encounter (HOSPITAL_COMMUNITY): Payer: Self-pay
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
## 2024-10-16 ENCOUNTER — Ambulatory Visit: Admitting: Family Medicine

## 2024-10-16 NOTE — Progress Notes (Deleted)
   LILLETTE Ileana Collet, PhD, LAT, ATC acting as a scribe for Artist Lloyd, MD.  Brooke Lozano is a 48 y.o. female who presents to [facility] today for 108-month f/u LBP. Pt was last seen by Dr. Lloyd on 08/21/24 and was advised to use a heating pad, TENS unit, IBU, and prescribed cyclobenzaprine . Also referred to PT, but pt never scheduled any visits.  Today, pt reports ***  Dx imaging: 08/21/24 L-spine XR  Pertinent review of systems: ***  Relevant historical information: ***   Exam:  LMP  (LMP Unknown)  General: Well Developed, well nourished, and in no acute distress.   MSK: ***    Lab and Radiology Results No results found for this or any previous visit (from the past 72 hours). No results found.     Assessment and Plan: 48 y.o. female with ***   PDMP not reviewed this encounter. No orders of the defined types were placed in this encounter.  No orders of the defined types were placed in this encounter.    Discussed warning signs or symptoms. Please see discharge instructions. Patient expresses understanding.   ***

## 2024-10-30 ENCOUNTER — Encounter: Payer: Self-pay | Admitting: Family Medicine

## 2024-11-27 ENCOUNTER — Ambulatory Visit: Payer: Self-pay | Admitting: Obstetrics and Gynecology

## 2024-12-20 ENCOUNTER — Ambulatory Visit (HOSPITAL_COMMUNITY): Admitting: Clinical

## 2025-01-01 ENCOUNTER — Other Ambulatory Visit: Payer: Self-pay | Admitting: Nurse Practitioner

## 2025-01-01 DIAGNOSIS — G43009 Migraine without aura, not intractable, without status migrainosus: Secondary | ICD-10-CM

## 2025-01-03 ENCOUNTER — Ambulatory Visit (HOSPITAL_COMMUNITY): Admitting: Clinical

## 2025-01-04 ENCOUNTER — Ambulatory Visit (HOSPITAL_COMMUNITY): Admitting: Clinical

## 2025-01-04 ENCOUNTER — Encounter (HOSPITAL_COMMUNITY): Payer: Self-pay

## 2025-01-12 ENCOUNTER — Ambulatory Visit: Payer: Self-pay

## 2025-01-12 NOTE — Telephone Encounter (Signed)
 FYI Only or Action Required?: FYI only for provider: appointment scheduled on 01/12/25 with alternate provider.  Patient was last seen in primary care on 07/31/2024 by Georgina Speaks, FNP.  Called Nurse Triage reporting Sinusitis and Facial Pain.  Symptoms began several days ago.  Interventions attempted: OTC medications: Zicam and Theraflu  and Rest, hydration, or home remedies.  Symptoms are: stable.  Triage Disposition: See Physician Within 24 Hours (overriding Home Care)  Patient/caregiver understands and will follow disposition?: Yes                                  1. LOCATION: Where does it hurt?      Sinus cavities/face, behind eyes 2. ONSET: When did the sinus pain start?  (e.g., hours, days)      Wednesday  3. SEVERITY: How bad is the pain?   (Scale 0-10; or none, mild, moderate or severe)     Rates pain an 8 5. NASAL CONGESTION: Is the nose blocked? If Yes, ask: Can you open it or must you breathe through your mouth?     Denies difficulty breathing 7. FEVER: Do you have a fever? If Yes, ask: What is it, how was it measured, and when did it start?      Denies 8. OTHER SYMPTOMS: Do you have any other symptoms? (e.g., sore throat, cough, earache, difficulty breathing)     Lethargic, sneezing, runny nose Denies: chest pain, wheezing, redness/swelling along sinuses, earache  Reason for Disposition  [1] Sinus congestion as part of a cold AND [2] present < 10 days  Protocols used: Sinus Pain or Congestion-A-AH  Summary: sinus pain, Very tired   Reason for Triage: sinus pain congestion sneezing Very tired

## 2025-01-17 ENCOUNTER — Ambulatory Visit (HOSPITAL_COMMUNITY): Admitting: Clinical

## 2025-08-06 ENCOUNTER — Encounter: Payer: Self-pay | Admitting: Nurse Practitioner
